# Patient Record
Sex: Female | Born: 1945
Health system: Southern US, Community
[De-identification: ages and names within clinical notes are randomized; demographics above are authoritative.]

## PROBLEM LIST (undated history)

## (undated) DIAGNOSIS — IMO0002 Reserved for concepts with insufficient information to code with codable children: Secondary | ICD-10-CM

## (undated) DIAGNOSIS — Z973 Presence of spectacles and contact lenses: Secondary | ICD-10-CM

## (undated) DIAGNOSIS — I10 Essential (primary) hypertension: Secondary | ICD-10-CM

## (undated) DIAGNOSIS — IMO0001 Reserved for inherently not codable concepts without codable children: Secondary | ICD-10-CM

## (undated) DIAGNOSIS — F419 Anxiety disorder, unspecified: Secondary | ICD-10-CM

## (undated) HISTORY — DX: Reserved for inherently not codable concepts without codable children: IMO0001

## (undated) HISTORY — PX: TONSILLECTOMY: SUR1361

## (undated) HISTORY — PX: BREAST ENHANCEMENT SURGERY: SHX7

## (undated) HISTORY — DX: Reserved for concepts with insufficient information to code with codable children: IMO0002

## (undated) HISTORY — PX: TUBAL LIGATION: SHX77

---

## 2012-02-26 DIAGNOSIS — S63639A Sprain of interphalangeal joint of unspecified finger, initial encounter: Secondary | ICD-10-CM | POA: Diagnosis not present

## 2012-03-05 DIAGNOSIS — S63639A Sprain of interphalangeal joint of unspecified finger, initial encounter: Secondary | ICD-10-CM | POA: Diagnosis not present

## 2012-05-28 DIAGNOSIS — E785 Hyperlipidemia, unspecified: Secondary | ICD-10-CM | POA: Diagnosis not present

## 2012-05-28 DIAGNOSIS — R82998 Other abnormal findings in urine: Secondary | ICD-10-CM | POA: Diagnosis not present

## 2012-05-28 DIAGNOSIS — I10 Essential (primary) hypertension: Secondary | ICD-10-CM | POA: Diagnosis not present

## 2012-06-04 DIAGNOSIS — E785 Hyperlipidemia, unspecified: Secondary | ICD-10-CM | POA: Diagnosis not present

## 2012-06-04 DIAGNOSIS — I10 Essential (primary) hypertension: Secondary | ICD-10-CM | POA: Diagnosis not present

## 2012-06-04 DIAGNOSIS — Z0000 Encounter for general adult medical examination without abnormal findings: Secondary | ICD-10-CM | POA: Diagnosis not present

## 2012-09-18 DIAGNOSIS — R928 Other abnormal and inconclusive findings on diagnostic imaging of breast: Secondary | ICD-10-CM | POA: Diagnosis not present

## 2012-09-18 DIAGNOSIS — Z09 Encounter for follow-up examination after completed treatment for conditions other than malignant neoplasm: Secondary | ICD-10-CM | POA: Diagnosis not present

## 2012-11-18 DIAGNOSIS — L570 Actinic keratosis: Secondary | ICD-10-CM | POA: Diagnosis not present

## 2013-01-15 DIAGNOSIS — H43819 Vitreous degeneration, unspecified eye: Secondary | ICD-10-CM | POA: Diagnosis not present

## 2013-02-18 DIAGNOSIS — H43819 Vitreous degeneration, unspecified eye: Secondary | ICD-10-CM | POA: Diagnosis not present

## 2013-02-18 DIAGNOSIS — D3130 Benign neoplasm of unspecified choroid: Secondary | ICD-10-CM | POA: Diagnosis not present

## 2013-05-29 DIAGNOSIS — E785 Hyperlipidemia, unspecified: Secondary | ICD-10-CM | POA: Diagnosis not present

## 2013-05-29 DIAGNOSIS — R82998 Other abnormal findings in urine: Secondary | ICD-10-CM | POA: Diagnosis not present

## 2013-05-29 DIAGNOSIS — I10 Essential (primary) hypertension: Secondary | ICD-10-CM | POA: Diagnosis not present

## 2013-06-05 DIAGNOSIS — Z0000 Encounter for general adult medical examination without abnormal findings: Secondary | ICD-10-CM | POA: Diagnosis not present

## 2013-06-05 DIAGNOSIS — IMO0002 Reserved for concepts with insufficient information to code with codable children: Secondary | ICD-10-CM | POA: Diagnosis not present

## 2013-06-05 DIAGNOSIS — I10 Essential (primary) hypertension: Secondary | ICD-10-CM | POA: Diagnosis not present

## 2013-06-05 DIAGNOSIS — E785 Hyperlipidemia, unspecified: Secondary | ICD-10-CM | POA: Diagnosis not present

## 2013-06-05 DIAGNOSIS — Z1331 Encounter for screening for depression: Secondary | ICD-10-CM | POA: Diagnosis not present

## 2013-06-06 DIAGNOSIS — Z1212 Encounter for screening for malignant neoplasm of rectum: Secondary | ICD-10-CM | POA: Diagnosis not present

## 2013-07-19 DIAGNOSIS — S63659A Sprain of metacarpophalangeal joint of unspecified finger, initial encounter: Secondary | ICD-10-CM | POA: Diagnosis not present

## 2013-07-28 ENCOUNTER — Other Ambulatory Visit: Payer: Self-pay | Admitting: Orthopedic Surgery

## 2013-07-28 DIAGNOSIS — M79645 Pain in left finger(s): Secondary | ICD-10-CM

## 2013-07-28 DIAGNOSIS — S63659A Sprain of metacarpophalangeal joint of unspecified finger, initial encounter: Secondary | ICD-10-CM | POA: Diagnosis not present

## 2013-08-05 DIAGNOSIS — M899 Disorder of bone, unspecified: Secondary | ICD-10-CM | POA: Diagnosis not present

## 2013-08-06 ENCOUNTER — Ambulatory Visit
Admission: RE | Admit: 2013-08-06 | Discharge: 2013-08-06 | Disposition: A | Discharge status: Home / Self Care | Payer: Medicare Other | Source: Ambulatory Visit | Attending: Orthopedic Surgery | Admitting: Orthopedic Surgery

## 2013-08-06 DIAGNOSIS — M7989 Other specified soft tissue disorders: Secondary | ICD-10-CM | POA: Diagnosis not present

## 2013-08-06 DIAGNOSIS — M79645 Pain in left finger(s): Secondary | ICD-10-CM

## 2013-08-06 DIAGNOSIS — M25549 Pain in joints of unspecified hand: Secondary | ICD-10-CM | POA: Diagnosis not present

## 2013-08-06 DIAGNOSIS — M79609 Pain in unspecified limb: Secondary | ICD-10-CM | POA: Diagnosis not present

## 2013-08-06 MED ORDER — IOHEXOL 180 MG/ML  SOLN
1.0000 mL | Freq: Once | INTRAMUSCULAR | Status: AC | PRN
  Administered 2013-08-06: 1 mL via INTRA_ARTICULAR

## 2013-08-07 DIAGNOSIS — Z23 Encounter for immunization: Secondary | ICD-10-CM | POA: Diagnosis not present

## 2013-08-13 ENCOUNTER — Other Ambulatory Visit: Payer: Self-pay | Admitting: Orthopedic Surgery

## 2013-08-13 ENCOUNTER — Encounter (HOSPITAL_BASED_OUTPATIENT_CLINIC_OR_DEPARTMENT_OTHER): Payer: Self-pay | Admitting: *Deleted

## 2013-08-13 DIAGNOSIS — S63659A Sprain of metacarpophalangeal joint of unspecified finger, initial encounter: Secondary | ICD-10-CM | POA: Diagnosis not present

## 2013-08-13 NOTE — Progress Notes (Signed)
To come in for ekg-bmet-no cardiac-had ekg 3 yr ago dr Neale Burly

## 2013-08-14 ENCOUNTER — Encounter (HOSPITAL_BASED_OUTPATIENT_CLINIC_OR_DEPARTMENT_OTHER)
Admission: RE | Admit: 2013-08-14 | Discharge: 2013-08-14 | Disposition: A | Discharge status: Home / Self Care | Payer: Medicare Other | Source: Ambulatory Visit | Attending: Orthopedic Surgery | Admitting: Orthopedic Surgery

## 2013-08-14 DIAGNOSIS — Z01810 Encounter for preprocedural cardiovascular examination: Secondary | ICD-10-CM | POA: Diagnosis not present

## 2013-08-14 DIAGNOSIS — IMO0002 Reserved for concepts with insufficient information to code with codable children: Secondary | ICD-10-CM | POA: Diagnosis not present

## 2013-08-14 DIAGNOSIS — I10 Essential (primary) hypertension: Secondary | ICD-10-CM | POA: Diagnosis not present

## 2013-08-14 DIAGNOSIS — Z01812 Encounter for preprocedural laboratory examination: Secondary | ICD-10-CM | POA: Diagnosis not present

## 2013-08-14 LAB — BASIC METABOLIC PANEL
BUN: 14 mg/dL (ref 6–23)
CO2: 29 mEq/L (ref 19–32)
Calcium: 9.8 mg/dL (ref 8.4–10.5)
GFR calc non Af Amer: 90 mL/min — ABNORMAL LOW (ref >90)
Glucose, Bld: 85 mg/dL (ref 70–99)
Sodium: 136 mEq/L (ref 135–145)

## 2013-08-18 ENCOUNTER — Ambulatory Visit (HOSPITAL_BASED_OUTPATIENT_CLINIC_OR_DEPARTMENT_OTHER)
Admission: RE | Admit: 2013-08-18 | Discharge: 2013-08-18 | Disposition: A | Discharge status: Home / Self Care | Payer: Medicare Other | Source: Ambulatory Visit | Attending: Orthopedic Surgery | Admitting: Orthopedic Surgery

## 2013-08-18 ENCOUNTER — Ambulatory Visit (HOSPITAL_BASED_OUTPATIENT_CLINIC_OR_DEPARTMENT_OTHER): Payer: Medicare Other | Admitting: Certified Registered Nurse Anesthetist

## 2013-08-18 ENCOUNTER — Encounter (HOSPITAL_BASED_OUTPATIENT_CLINIC_OR_DEPARTMENT_OTHER): Payer: Self-pay | Admitting: Certified Registered Nurse Anesthetist

## 2013-08-18 ENCOUNTER — Encounter (HOSPITAL_BASED_OUTPATIENT_CLINIC_OR_DEPARTMENT_OTHER)
Admission: RE | Disposition: A | Discharge status: Home / Self Care | Payer: Self-pay | Source: Ambulatory Visit | Attending: Orthopedic Surgery

## 2013-08-18 DIAGNOSIS — Z01812 Encounter for preprocedural laboratory examination: Secondary | ICD-10-CM | POA: Diagnosis not present

## 2013-08-18 DIAGNOSIS — S63659A Sprain of metacarpophalangeal joint of unspecified finger, initial encounter: Secondary | ICD-10-CM | POA: Diagnosis not present

## 2013-08-18 DIAGNOSIS — I10 Essential (primary) hypertension: Secondary | ICD-10-CM | POA: Diagnosis not present

## 2013-08-18 DIAGNOSIS — IMO0002 Reserved for concepts with insufficient information to code with codable children: Secondary | ICD-10-CM | POA: Insufficient documentation

## 2013-08-18 DIAGNOSIS — W19XXXA Unspecified fall, initial encounter: Secondary | ICD-10-CM | POA: Insufficient documentation

## 2013-08-18 HISTORY — DX: Essential (primary) hypertension: I10

## 2013-08-18 HISTORY — PX: ULNAR COLLATERAL LIGAMENT REPAIR: SHX6159

## 2013-08-18 HISTORY — DX: Presence of spectacles and contact lenses: Z97.3

## 2013-08-18 SURGERY — REPAIR, LIGAMENT, ULNAR COLLATERAL
Anesthesia: General | Site: Thumb | Laterality: Left | Wound class: Clean

## 2013-08-18 MED ORDER — FENTANYL CITRATE 0.05 MG/ML IJ SOLN: 50.0000 ug | INTRAMUSCULAR | Status: DC | PRN

## 2013-08-18 MED ORDER — LIDOCAINE HCL (CARDIAC) 20 MG/ML IV SOLN
INTRAVENOUS | Status: DC | PRN
  Administered 2013-08-18: 60 mg via INTRAVENOUS

## 2013-08-18 MED ORDER — PROPOFOL 10 MG/ML IV BOLUS
INTRAVENOUS | Status: DC | PRN
  Administered 2013-08-18: 200 mg via INTRAVENOUS

## 2013-08-18 MED ORDER — BUPIVACAINE HCL (PF) 0.25 % IJ SOLN
INTRAMUSCULAR | Status: DC | PRN
  Administered 2013-08-18: 7 mL

## 2013-08-18 MED ORDER — OXYCODONE HCL 5 MG PO TABS
5.0000 mg | ORAL_TABLET | Freq: Once | ORAL | Status: AC | PRN
Start: 2013-08-18 — End: 2013-08-18
  Administered 2013-08-18: 5 mg via ORAL

## 2013-08-18 MED ORDER — MIDAZOLAM HCL 2 MG/2ML IJ SOLN: 1.0000 mg | INTRAMUSCULAR | Status: DC | PRN

## 2013-08-18 MED ORDER — MIDAZOLAM HCL 5 MG/5ML IJ SOLN
INTRAMUSCULAR | Status: DC | PRN
  Administered 2013-08-18: 1 mg via INTRAVENOUS

## 2013-08-18 MED ORDER — DEXAMETHASONE SODIUM PHOSPHATE 10 MG/ML IJ SOLN
INTRAMUSCULAR | Status: DC | PRN
  Administered 2013-08-18: 10 mg via INTRAVENOUS

## 2013-08-18 MED ORDER — CHLORHEXIDINE GLUCONATE 4 % EX LIQD: 60.0000 mL | Freq: Once | CUTANEOUS | Status: DC

## 2013-08-18 MED ORDER — FENTANYL CITRATE 0.05 MG/ML IJ SOLN
INTRAMUSCULAR | Status: DC | PRN
  Administered 2013-08-18 (×2): 25 ug via INTRAVENOUS
  Administered 2013-08-18: 50 ug via INTRAVENOUS
  Administered 2013-08-18: 25 ug via INTRAVENOUS

## 2013-08-18 MED ORDER — ONDANSETRON HCL 4 MG/2ML IJ SOLN
INTRAMUSCULAR | Status: DC | PRN
  Administered 2013-08-18: 4 mg via INTRAMUSCULAR

## 2013-08-18 MED ORDER — HYDROCODONE-ACETAMINOPHEN 5-325 MG PO TABS: ORAL_TABLET | ORAL | Status: DC

## 2013-08-18 MED ORDER — PROMETHAZINE HCL 25 MG/ML IJ SOLN: 6.2500 mg | INTRAMUSCULAR | Status: DC | PRN

## 2013-08-18 MED ORDER — HYDROMORPHONE HCL PF 1 MG/ML IJ SOLN
0.2500 mg | INTRAMUSCULAR | Status: DC | PRN
  Administered 2013-08-18: 0.5 mg via INTRAVENOUS

## 2013-08-18 MED ORDER — OXYCODONE HCL 5 MG/5ML PO SOLN: 5.0000 mg | Freq: Once | ORAL | Status: AC | PRN

## 2013-08-18 MED ORDER — LACTATED RINGERS IV SOLN
INTRAVENOUS | Status: DC
  Administered 2013-08-18 (×2): via INTRAVENOUS

## 2013-08-18 MED ORDER — CEFAZOLIN SODIUM-DEXTROSE 2-3 GM-% IV SOLR
2.0000 g | INTRAVENOUS | Status: AC
  Administered 2013-08-18: 2 g via INTRAVENOUS

## 2013-08-18 SURGICAL SUPPLY — 74 items
ANCH SUT 2-0 MN NDL DRL PLSTR (Anchor) ×1 IMPLANT
ANCHOR JUGGERKNOT 1.0 1DR 2-0 (Anchor) ×1 IMPLANT
BANDAGE ELASTIC 3 VELCRO ST LF (GAUZE/BANDAGES/DRESSINGS) IMPLANT
BANDAGE GAUZE ELAST BULKY 4 IN (GAUZE/BANDAGES/DRESSINGS) ×2 IMPLANT
BLADE MINI RND TIP GREEN BEAV (BLADE) ×2 IMPLANT
BLADE SURG 15 STRL LF DISP TIS (BLADE) ×2 IMPLANT
BLADE SURG 15 STRL SS (BLADE) ×4
BNDG CMPR 9X4 STRL LF SNTH (GAUZE/BANDAGES/DRESSINGS) ×1
BNDG CMPR MD 5X2 ELC HKLP STRL (GAUZE/BANDAGES/DRESSINGS)
BNDG ELASTIC 2 VLCR STRL LF (GAUZE/BANDAGES/DRESSINGS) IMPLANT
BNDG ESMARK 4X9 LF (GAUZE/BANDAGES/DRESSINGS) ×2 IMPLANT
CATH ROBINSON RED A/P 8FR (CATHETERS) IMPLANT
CHLORAPREP W/TINT 26ML (MISCELLANEOUS) ×2 IMPLANT
CLOTH BEACON ORANGE TIMEOUT ST (SAFETY) ×2 IMPLANT
CORDS BIPOLAR (ELECTRODE) ×2 IMPLANT
COVER MAYO STAND STRL (DRAPES) ×2 IMPLANT
COVER TABLE BACK 60X90 (DRAPES) ×2 IMPLANT
CUFF TOURNIQUET SINGLE 18IN (TOURNIQUET CUFF) ×2 IMPLANT
DECANTER SPIKE VIAL GLASS SM (MISCELLANEOUS) IMPLANT
DRAPE EXTREMITY T 121X128X90 (DRAPE) ×2 IMPLANT
DRAPE OEC MINIVIEW 54X84 (DRAPES) ×1 IMPLANT
DRAPE SURG 17X23 STRL (DRAPES) ×2 IMPLANT
DRSG PAD ABDOMINAL 8X10 ST (GAUZE/BANDAGES/DRESSINGS) ×1 IMPLANT
GAUZE XEROFORM 1X8 LF (GAUZE/BANDAGES/DRESSINGS) ×2 IMPLANT
GLOVE BIO SURGEON STRL SZ 6.5 (GLOVE) ×1 IMPLANT
GLOVE BIO SURGEON STRL SZ7.5 (GLOVE) ×2 IMPLANT
GLOVE BIOGEL PI IND STRL 7.0 (GLOVE) ×2 IMPLANT
GLOVE BIOGEL PI IND STRL 8 (GLOVE) ×1 IMPLANT
GLOVE BIOGEL PI IND STRL 8.5 (GLOVE) ×1 IMPLANT
GLOVE BIOGEL PI INDICATOR 7.0 (GLOVE) ×2
GLOVE BIOGEL PI INDICATOR 8 (GLOVE) ×1
GLOVE BIOGEL PI INDICATOR 8.5 (GLOVE)
GLOVE SURG ORTHO 8.0 STRL STRW (GLOVE) IMPLANT
GOWN BRE IMP PREV XXLGXLNG (GOWN DISPOSABLE) ×2 IMPLANT
GOWN PREVENTION PLUS XLARGE (GOWN DISPOSABLE) ×2 IMPLANT
K-WIRE .035X4 (WIRE) IMPLANT
NDL SAFETY ECLIPSE 18X1.5 (NEEDLE) IMPLANT
NEEDLE HYPO 18GX1.5 SHARP (NEEDLE)
NEEDLE HYPO 22GX1.5 SAFETY (NEEDLE) IMPLANT
NEEDLE HYPO 25X1 1.5 SAFETY (NEEDLE) ×2 IMPLANT
NEEDLE KEITH (NEEDLE) IMPLANT
NS IRRIG 1000ML POUR BTL (IV SOLUTION) ×2 IMPLANT
PACK BASIN DAY SURGERY FS (CUSTOM PROCEDURE TRAY) ×2 IMPLANT
PAD CAST 3X4 CTTN HI CHSV (CAST SUPPLIES) ×1 IMPLANT
PAD CAST 4YDX4 CTTN HI CHSV (CAST SUPPLIES) IMPLANT
PADDING CAST ABS 4INX4YD NS (CAST SUPPLIES) ×1
PADDING CAST ABS COTTON 4X4 ST (CAST SUPPLIES) ×1 IMPLANT
PADDING CAST COTTON 3X4 STRL (CAST SUPPLIES) ×2
PADDING CAST COTTON 4X4 STRL (CAST SUPPLIES)
PASSER SUT SWANSON 36MM LOOP (INSTRUMENTS) IMPLANT
SLEEVE SCD COMPRESS KNEE MED (MISCELLANEOUS) ×1 IMPLANT
SPONGE GAUZE 4X4 12PLY (GAUZE/BANDAGES/DRESSINGS) ×2 IMPLANT
STAPLER VISISTAT 35W (STAPLE) IMPLANT
STOCKINETTE 4X48 STRL (DRAPES) ×2 IMPLANT
SUT ETHIBOND 3-0 V-5 (SUTURE) IMPLANT
SUT ETHILON 3 0 PS 1 (SUTURE) IMPLANT
SUT ETHILON 4 0 PS 2 18 (SUTURE) ×2 IMPLANT
SUT FIBERWIRE 2-0 18 17.9 3/8 (SUTURE)
SUT FIBERWIRE 3-0 18 TAPR NDL (SUTURE)
SUT MERSILENE 2.0 SH NDLE (SUTURE) IMPLANT
SUT MERSILENE 4 0 P 3 (SUTURE) ×2 IMPLANT
SUT MERSILENE 6 0 P 1 (SUTURE) IMPLANT
SUT MNCRL AB 4-0 PS2 18 (SUTURE) ×1 IMPLANT
SUT SILK 4 0 PS 2 (SUTURE) IMPLANT
SUT VIC AB 0 SH 27 (SUTURE) IMPLANT
SUT VIC AB 4-0 P-3 18XBRD (SUTURE) IMPLANT
SUT VIC AB 4-0 P3 18 (SUTURE) ×2
SUT VICRYL 4-0 PS2 18IN ABS (SUTURE) IMPLANT
SUTURE FIBERWR 2-0 18 17.9 3/8 (SUTURE) IMPLANT
SUTURE FIBERWR 3-0 18 TAPR NDL (SUTURE) IMPLANT
SYR BULB 3OZ (MISCELLANEOUS) ×2 IMPLANT
SYR CONTROL 10ML LL (SYRINGE) ×1 IMPLANT
TOWEL OR 17X24 6PK STRL BLUE (TOWEL DISPOSABLE) ×4 IMPLANT
UNDERPAD 30X30 INCONTINENT (UNDERPADS AND DIAPERS) ×2 IMPLANT

## 2013-08-18 NOTE — H&P (Signed)
Stacey Doyle is an 67 y.o. female.   Chief Complaint: left thumb ulnar collateral ligament injury HPI: 67 yo rhd female states she injured left thumb in a fall one month ago.  Pain in mp joint of thumb.  Has been wearing thumb spica splint.  MRI confirms UCL tear with Stener lesion.  She wishes to have Neurosurgeon.  Past Medical History  Diagnosis Date  . Hypertension   . Wears glasses     Past Surgical History  Procedure Laterality Date  . Tonsillectomy    . Breast enhancement surgery  [id number]    implants  . Tubal ligation      History reviewed. No pertinent family history. Social History:  reports that she quit smoking about 16 years ago. She does not have any smokeless tobacco history on file. She reports that  drinks alcohol. She reports that she does not use illicit drugs.  Allergies:  Allergies  Allergen Reactions  . Percocet [Oxycodone-Acetaminophen]     Nightmares, makes nervous, skin crawles    Medications Prior to Admission  Medication Sig Dispense Refill  . calcium carbonate (OS-CAL - DOSED IN MG OF ELEMENTAL CALCIUM) 1250 MG tablet Take 1 tablet by mouth.      . cholecalciferol (VITAMIN D) 1000 UNITS tablet Take 1,000 Units by mouth daily.      Marland Kitchen glucosamine-chondroitin 500-400 MG tablet Take 1 tablet by mouth 3 (three) times daily.      Marland Kitchen olmesartan (BENICAR) 20 MG tablet Take 20 mg by mouth daily.      . vitamin C (ASCORBIC ACID) 250 MG tablet Take 250 mg by mouth daily.        No results found for this or any previous visit (from the past 48 hour(s)).  No results found.   A comprehensive review of systems was negative except for: Eyes: positive for contacts/glasses Ears, nose, mouth, throat, and face: positive for tinnitus  Blood pressure 157/87, pulse 83, temperature 97.8 F (36.6 C), temperature source Oral, resp. rate 20, height 5\' 4"  (1.626 m), weight 141 lb (63.957 kg), SpO2 98.00%.  General appearance: alert, cooperative and appears stated  age Head: Normocephalic, without obvious abnormality, atraumatic Neck: supple, symmetrical, trachea midline Resp: clear to auscultation bilaterally Cardio: regular rate and rhythm GI: non tender Extremities: intact sensation and capillary refill all digits.  +epl/fpl/io.  laxity to ucl left thumb.  skin intact. Pulses: 2+ and symmetric Skin: Skin color, texture, turgor normal. No rashes or lesions Neurologic: Grossly normal Incision/Wound: na  Assessment/Plan Left thumb UCL injury.  Non operative and operative treatment options were discussed with the patient and patient wishes to proceed with operative treatment. Risks, benefits, and alternatives of surgery were discussed and the patient agrees with the plan of care.   Rashaan Wyles R 08/18/2013, 8:30 AM

## 2013-08-18 NOTE — Anesthesia Postprocedure Evaluation (Signed)
Anesthesia Post Note  Patient: Stacey Doyle  Procedure(s) Performed: Procedure(s) (LRB): LEFT THUMB ULNAR COLLATERAL LIGAMENT REPAIR (Left)  Anesthesia type: general  Patient location: PACU  Post pain: Pain level controlled  Post assessment: Patient's Cardiovascular Status Stable  Last Vitals:  Filed Vitals:   08/18/13 1145  BP: 138/62  Pulse: 69  Temp:   Resp: 16    Post vital signs: Reviewed and stable  Level of consciousness: sedated  Complications: No apparent anesthesia complications

## 2013-08-18 NOTE — Anesthesia Procedure Notes (Addendum)
Procedure Name: LMA Insertion Date/Time: 08/18/2013 9:25 AM Performed by: Ambera Fedele D Pre-anesthesia Checklist: Patient identified, Emergency Drugs available, Suction available and Patient being monitored Patient Re-evaluated:Patient Re-evaluated prior to inductionOxygen Delivery Method: Circle System Utilized Preoxygenation: Pre-oxygenation with 100% oxygen Intubation Type: IV induction Ventilation: Mask ventilation without difficulty LMA: LMA inserted LMA Size: 4.0 Number of attempts: 1 Airway Equipment and Method: bite block Placement Confirmation: positive ETCO2 Tube secured with: Tape Dental Injury: Teeth and Oropharynx as per pre-operative assessment

## 2013-08-18 NOTE — Transfer of Care (Signed)
Immediate Anesthesia Transfer of Care Note  Patient: Stacey Doyle  Procedure(s) Performed: Procedure(s): LEFT THUMB ULNAR COLLATERAL LIGAMENT REPAIR (Left)  Patient Location: PACU  Anesthesia Type:General  Level of Consciousness: awake, alert , oriented and patient cooperative  Airway & Oxygen Therapy: Patient Spontanous Breathing and Patient connected to face mask oxygen  Post-op Assessment: Report given to PACU RN and Post -op Vital signs reviewed and stable  Post vital signs: Reviewed and stable  Complications: No apparent anesthesia complications

## 2013-08-18 NOTE — Op Note (Signed)
[id number] 

## 2013-08-18 NOTE — Anesthesia Preprocedure Evaluation (Addendum)
Anesthesia Evaluation  Patient identified by MRN, date of birth, ID band Patient awake    Reviewed: Allergy & Precautions, NPO status   Airway Mallampati: II TM Distance: >3 FB Neck ROM: Full    Dental  (+) Teeth Intact and Dental Advisory Given   Pulmonary former smoker,    Pulmonary exam normal       Cardiovascular hypertension, Pt. on medications     Neuro/Psych negative neurological ROS  negative psych ROS   GI/Hepatic negative GI ROS, Neg liver ROS,   Endo/Other  negative endocrine ROS  Renal/GU negative Renal ROS     Musculoskeletal   Abdominal   Peds  Hematology   Anesthesia Other Findings   Reproductive/Obstetrics                         Anesthesia Physical Anesthesia Plan  ASA: II  Anesthesia Plan: General   Post-op Pain Management:    Induction: Intravenous  Airway Management Planned: LMA  Additional Equipment:   Intra-op Plan:   Post-operative Plan: Extubation in OR  Informed Consent: I have reviewed the patients History and Physical, chart, labs and discussed the procedure including the risks, benefits and alternatives for the proposed anesthesia with the patient or authorized representative who has indicated his/her understanding and acceptance.   Dental advisory given  Plan Discussed with: CRNA, Anesthesiologist and Surgeon  Anesthesia Plan Comments:        Anesthesia Quick Evaluation

## 2013-08-18 NOTE — Brief Op Note (Signed)
08/18/2013  10:47 AM  PATIENT:  Cleda Mccreedy  67 y.o. female  PRE-OPERATIVE DIAGNOSIS:  LEFT THUMB UCL TEAR 842.12  POST-OPERATIVE DIAGNOSIS:  left ulnarcollateral ligament tear thumb  PROCEDURE:  Procedure(s): LEFT THUMB ULNAR COLLATERAL LIGAMENT REPAIR  SURGEON:  Surgeon(s): Tami Ribas, MD  PHYSICIAN ASSISTANT:   ASSISTANTS: none   ANESTHESIA:   general  EBL:  Total I/O In: 1200 [I.V.:1200] Out: -   DRAINS: none   LOCAL MEDICATIONS USED:  MARCAINE     SPECIMEN:  No Specimen  DISPOSITION OF SPECIMEN:  N/A  COUNTS:  YES  TOURNIQUET:   Total Tourniquet Time Documented: Upper Arm (Left) - 61 minutes Total: Upper Arm (Left) - 61 minutes   DICTATION: .Other Dictation: Dictation Number [id number]  PLAN OF CARE: Discharge to home after PACU

## 2013-08-19 ENCOUNTER — Encounter (HOSPITAL_BASED_OUTPATIENT_CLINIC_OR_DEPARTMENT_OTHER): Payer: Self-pay | Admitting: Orthopedic Surgery

## 2013-08-19 NOTE — Op Note (Signed)
NAMEMarland Kitchen  SOUA, LENK NO.:  [id number]  MEDICAL RECORD NO.:  [id number]  LOCATION:                                 FACILITY:  PHYSICIAN:  Betha Loa, MD             DATE OF BIRTH:  DATE OF PROCEDURE:  08/18/2013 DATE OF DISCHARGE:                              OPERATIVE REPORT   PREOPERATIVE DIAGNOSIS:  Right left thumb ulnar collateral ligament injury.  POSTOPERATIVE DIAGNOSIS:  Right left thumb ulnar collateral ligament injury.  PROCEDURE:  Right thumb ulnar collateral ligament repair.  SURGEON:  Betha Loa, MD.  ASSISTANT:  None.  ANESTHESIA:  General.  IV FLUIDS:  Per anesthesia flow sheet.  ESTIMATED BLOOD LOSS:  Minimal.  COMPLICATIONS:  None.  SPECIMENS:  None.  TOURNIQUET TIME:  61 minutes.  DISPOSITION:  Stable to PACU.  INDICATIONS:  Ms. Kruk is a 66 year old female who 1 month ago fell injuring her left thumb.  She followed up with me in the office.  She had laxity on the ulnar side of the MP joint of the thumb.  An MRI confirmed ulnar collateral ligament tear.  I discussed with Ms. Bellmore the nature of the condition.  She elected to proceed with operative repair.  Risks, benefits, and alternatives of surgery were discussed including the risk of blood loss, infection, damage to nerves, vessels, tendons, ligaments, bone; failure of surgery; need for additional surgery, complications with wound healing, continued pain, and advancement of arthritis.  She voiced understanding of these risks and elected to proceed.  OPERATIVE COURSE:  After being identified preoperatively by myself, the patient and I agreed upon the procedure and site of the procedure. Surgical site was marked.  Risks, benefits, and alternatives of surgery were reviewed and she wished to proceed.  Surgical consent had been signed.  She was given IV Ancef as preoperative antibiotic prophylaxis. She was transferred to the operating room and placed on the  operating room table in supine position with the left upper extremity on arm board.  General anesthesia was induced by anesthesiologist.  Left upper extremity was prepped and draped in normal sterile orthopedic fashion. A surgical pause was performed between surgeons, Anesthesia, operating staff, and all were in agreement as to the patient, procedure, and site of procedure.  Tourniquet at the proximal aspect of the extremity was inflated to 250 mmHg after exsanguination of the hand by grabbing the forearm by Esmarch bandage.  A lazy-S incision was made at the ulnar side of the thumb at the MP joint.  This was carried into subcutaneous tissues by spreading technique.  Bipolar electrocautery was used to obtain hemostasis.  Care was taken to protect all the dorsal branches of the sensory nerve.  The adductor aponeurosis was incised.  The ulnar collateral ligament was identified.  It had been avulsed from the metacarpal head.  The sulcus over the ligament originates from was empty.  The ligament was found within the joint.  The joint was inspected.  There was a small thin sheet of articular cartilage which was removed.  The articular cartilage overall looked good.  The wound was copiously irrigated with sterile saline.  A knot suture anchor was used after preparing the sulcus with the curette.  This was used to repair the proximal aspect of the ligament back into the sulcus.  This was done with the thumb in 45 degrees of flexion.  Care was taken to ensure appropriate tension.  This provided stability of the thumb.  C- arm was used in AP and lateral projections to ensure appropriate position of the thumb which was the case.  There was no volar subluxation.  The repair was augmented with 4-0 Vicryl suture around the soft tissues.  The adductor aponeurosis was repaired with a 4-0 Mersilene in a running fashion.  The Vicryl suture was used in an interrupted fashion, subcutaneous tissues, and the  skin was closed with a running 5-0 Monocryl in a subcuticular fashion.  This was augmented with benzoin and Steri-Strips.  The wound was injected with 7 mL of 0.25% plain Marcaine to aid in postoperative analgesia.  It was then dressed with a sterile 4x4s and Kerlix bandage.  A thumb spica splint was placed and wrapped with a Kerlix and Ace bandage.  Tourniquet was deflated at 61 minutes.  Fingertips were pink with brisk capillary refill after deflation of the tourniquet.  The operative drapes were broken down.  The patient was awoken from anesthesia safely.  She has transferred back to the stretcher and taken to PACU in stable condition. I will see her back in the office in 1 week for postoperative followup. She will be given Norco 5/325, 1-2 p.o. q.6 hours p.r.n. pain, dispensed #30.     Betha Loa, MD     KK/MEDQ  D:  08/18/2013  T:  08/19/2013  Job:  [id number]

## 2013-09-29 DIAGNOSIS — S63659A Sprain of metacarpophalangeal joint of unspecified finger, initial encounter: Secondary | ICD-10-CM | POA: Diagnosis not present

## 2013-10-01 DIAGNOSIS — S63659A Sprain of metacarpophalangeal joint of unspecified finger, initial encounter: Secondary | ICD-10-CM | POA: Diagnosis not present

## 2014-04-07 DIAGNOSIS — H35369 Drusen (degenerative) of macula, unspecified eye: Secondary | ICD-10-CM | POA: Diagnosis not present

## 2014-06-02 DIAGNOSIS — R809 Proteinuria, unspecified: Secondary | ICD-10-CM | POA: Diagnosis not present

## 2014-06-02 DIAGNOSIS — E785 Hyperlipidemia, unspecified: Secondary | ICD-10-CM | POA: Diagnosis not present

## 2014-06-02 DIAGNOSIS — R82998 Other abnormal findings in urine: Secondary | ICD-10-CM | POA: Diagnosis not present

## 2014-06-02 DIAGNOSIS — I10 Essential (primary) hypertension: Secondary | ICD-10-CM | POA: Diagnosis not present

## 2014-06-09 DIAGNOSIS — R55 Syncope and collapse: Secondary | ICD-10-CM | POA: Diagnosis not present

## 2014-06-09 DIAGNOSIS — Z1331 Encounter for screening for depression: Secondary | ICD-10-CM | POA: Diagnosis not present

## 2014-06-09 DIAGNOSIS — M1710 Unilateral primary osteoarthritis, unspecified knee: Secondary | ICD-10-CM | POA: Diagnosis not present

## 2014-06-09 DIAGNOSIS — Z6825 Body mass index (BMI) 25.0-25.9, adult: Secondary | ICD-10-CM | POA: Diagnosis not present

## 2014-06-09 DIAGNOSIS — R7301 Impaired fasting glucose: Secondary | ICD-10-CM | POA: Diagnosis not present

## 2014-06-09 DIAGNOSIS — I10 Essential (primary) hypertension: Secondary | ICD-10-CM | POA: Diagnosis not present

## 2014-06-09 DIAGNOSIS — E785 Hyperlipidemia, unspecified: Secondary | ICD-10-CM | POA: Diagnosis not present

## 2014-06-09 DIAGNOSIS — Z0000 Encounter for general adult medical examination without abnormal findings: Secondary | ICD-10-CM | POA: Diagnosis not present

## 2014-06-09 DIAGNOSIS — IMO0002 Reserved for concepts with insufficient information to code with codable children: Secondary | ICD-10-CM | POA: Diagnosis not present

## 2014-06-29 DIAGNOSIS — R0982 Postnasal drip: Secondary | ICD-10-CM | POA: Diagnosis not present

## 2014-06-29 DIAGNOSIS — J0110 Acute frontal sinusitis, unspecified: Secondary | ICD-10-CM | POA: Diagnosis not present

## 2014-06-29 DIAGNOSIS — R059 Cough, unspecified: Secondary | ICD-10-CM | POA: Diagnosis not present

## 2014-06-29 DIAGNOSIS — R05 Cough: Secondary | ICD-10-CM | POA: Diagnosis not present

## 2014-08-20 DIAGNOSIS — R490 Dysphonia: Secondary | ICD-10-CM | POA: Diagnosis not present

## 2014-08-20 DIAGNOSIS — J387 Other diseases of larynx: Secondary | ICD-10-CM | POA: Diagnosis not present

## 2014-09-17 DIAGNOSIS — R300 Dysuria: Secondary | ICD-10-CM | POA: Diagnosis not present

## 2014-09-17 DIAGNOSIS — R8299 Other abnormal findings in urine: Secondary | ICD-10-CM | POA: Diagnosis not present

## 2015-04-19 DIAGNOSIS — H2513 Age-related nuclear cataract, bilateral: Secondary | ICD-10-CM | POA: Diagnosis not present

## 2015-04-19 DIAGNOSIS — D3132 Benign neoplasm of left choroid: Secondary | ICD-10-CM | POA: Diagnosis not present

## 2015-06-07 DIAGNOSIS — Z86010 Personal history of colonic polyps: Secondary | ICD-10-CM | POA: Diagnosis not present

## 2015-06-07 DIAGNOSIS — D122 Benign neoplasm of ascending colon: Secondary | ICD-10-CM | POA: Diagnosis not present

## 2015-06-07 DIAGNOSIS — D126 Benign neoplasm of colon, unspecified: Secondary | ICD-10-CM | POA: Diagnosis not present

## 2015-06-29 DIAGNOSIS — E785 Hyperlipidemia, unspecified: Secondary | ICD-10-CM | POA: Diagnosis not present

## 2015-06-29 DIAGNOSIS — R7301 Impaired fasting glucose: Secondary | ICD-10-CM | POA: Diagnosis not present

## 2015-06-29 DIAGNOSIS — I10 Essential (primary) hypertension: Secondary | ICD-10-CM | POA: Diagnosis not present

## 2015-07-14 DIAGNOSIS — R7301 Impaired fasting glucose: Secondary | ICD-10-CM | POA: Diagnosis not present

## 2015-07-14 DIAGNOSIS — Z1389 Encounter for screening for other disorder: Secondary | ICD-10-CM | POA: Diagnosis not present

## 2015-07-14 DIAGNOSIS — E785 Hyperlipidemia, unspecified: Secondary | ICD-10-CM | POA: Diagnosis not present

## 2015-07-14 DIAGNOSIS — Z23 Encounter for immunization: Secondary | ICD-10-CM | POA: Diagnosis not present

## 2015-07-14 DIAGNOSIS — Z0000 Encounter for general adult medical examination without abnormal findings: Secondary | ICD-10-CM | POA: Diagnosis not present

## 2015-07-14 DIAGNOSIS — D126 Benign neoplasm of colon, unspecified: Secondary | ICD-10-CM | POA: Diagnosis not present

## 2015-07-14 DIAGNOSIS — M179 Osteoarthritis of knee, unspecified: Secondary | ICD-10-CM | POA: Diagnosis not present

## 2015-07-14 DIAGNOSIS — I10 Essential (primary) hypertension: Secondary | ICD-10-CM | POA: Diagnosis not present

## 2015-07-16 DIAGNOSIS — N951 Menopausal and female climacteric states: Secondary | ICD-10-CM | POA: Diagnosis not present

## 2015-07-16 DIAGNOSIS — R635 Abnormal weight gain: Secondary | ICD-10-CM | POA: Diagnosis not present

## 2015-07-23 DIAGNOSIS — N951 Menopausal and female climacteric states: Secondary | ICD-10-CM | POA: Diagnosis not present

## 2015-07-23 DIAGNOSIS — E663 Overweight: Secondary | ICD-10-CM | POA: Diagnosis not present

## 2015-07-23 DIAGNOSIS — K219 Gastro-esophageal reflux disease without esophagitis: Secondary | ICD-10-CM | POA: Diagnosis not present

## 2015-07-23 DIAGNOSIS — E039 Hypothyroidism, unspecified: Secondary | ICD-10-CM | POA: Diagnosis not present

## 2015-07-23 DIAGNOSIS — R7301 Impaired fasting glucose: Secondary | ICD-10-CM | POA: Diagnosis not present

## 2015-07-28 ENCOUNTER — Other Ambulatory Visit: Payer: Self-pay | Admitting: Internal Medicine

## 2015-07-28 DIAGNOSIS — Z1231 Encounter for screening mammogram for malignant neoplasm of breast: Secondary | ICD-10-CM

## 2015-07-29 DIAGNOSIS — E663 Overweight: Secondary | ICD-10-CM | POA: Diagnosis not present

## 2015-07-29 DIAGNOSIS — E039 Hypothyroidism, unspecified: Secondary | ICD-10-CM | POA: Diagnosis not present

## 2015-07-29 DIAGNOSIS — R7301 Impaired fasting glucose: Secondary | ICD-10-CM | POA: Diagnosis not present

## 2015-08-05 DIAGNOSIS — R7301 Impaired fasting glucose: Secondary | ICD-10-CM | POA: Diagnosis not present

## 2015-08-05 DIAGNOSIS — K219 Gastro-esophageal reflux disease without esophagitis: Secondary | ICD-10-CM | POA: Diagnosis not present

## 2015-08-05 DIAGNOSIS — E039 Hypothyroidism, unspecified: Secondary | ICD-10-CM | POA: Diagnosis not present

## 2015-08-05 DIAGNOSIS — E663 Overweight: Secondary | ICD-10-CM | POA: Diagnosis not present

## 2015-08-17 DIAGNOSIS — M79671 Pain in right foot: Secondary | ICD-10-CM | POA: Diagnosis not present

## 2015-08-17 DIAGNOSIS — M722 Plantar fascial fibromatosis: Secondary | ICD-10-CM | POA: Diagnosis not present

## 2015-08-24 DIAGNOSIS — M722 Plantar fascial fibromatosis: Secondary | ICD-10-CM | POA: Diagnosis not present

## 2015-08-24 DIAGNOSIS — L821 Other seborrheic keratosis: Secondary | ICD-10-CM | POA: Diagnosis not present

## 2015-08-24 DIAGNOSIS — M7751 Other enthesopathy of right foot: Secondary | ICD-10-CM | POA: Diagnosis not present

## 2015-08-24 DIAGNOSIS — L218 Other seborrheic dermatitis: Secondary | ICD-10-CM | POA: Diagnosis not present

## 2015-09-06 DIAGNOSIS — Z780 Asymptomatic menopausal state: Secondary | ICD-10-CM | POA: Diagnosis not present

## 2015-11-18 DIAGNOSIS — Z1231 Encounter for screening mammogram for malignant neoplasm of breast: Secondary | ICD-10-CM | POA: Diagnosis not present

## 2015-11-18 DIAGNOSIS — Z803 Family history of malignant neoplasm of breast: Secondary | ICD-10-CM | POA: Diagnosis not present

## 2015-12-30 DIAGNOSIS — Z1231 Encounter for screening mammogram for malignant neoplasm of breast: Secondary | ICD-10-CM | POA: Diagnosis not present

## 2015-12-30 DIAGNOSIS — Z803 Family history of malignant neoplasm of breast: Secondary | ICD-10-CM | POA: Diagnosis not present

## 2015-12-30 DIAGNOSIS — R928 Other abnormal and inconclusive findings on diagnostic imaging of breast: Secondary | ICD-10-CM | POA: Diagnosis not present

## 2015-12-30 DIAGNOSIS — R922 Inconclusive mammogram: Secondary | ICD-10-CM | POA: Diagnosis not present

## 2016-01-03 DIAGNOSIS — C50912 Malignant neoplasm of unspecified site of left female breast: Secondary | ICD-10-CM | POA: Diagnosis not present

## 2016-01-03 DIAGNOSIS — Z0000 Encounter for general adult medical examination without abnormal findings: Secondary | ICD-10-CM | POA: Diagnosis not present

## 2016-01-03 DIAGNOSIS — D0512 Intraductal carcinoma in situ of left breast: Secondary | ICD-10-CM | POA: Diagnosis not present

## 2016-01-03 DIAGNOSIS — R928 Other abnormal and inconclusive findings on diagnostic imaging of breast: Secondary | ICD-10-CM | POA: Diagnosis not present

## 2016-01-03 DIAGNOSIS — Z803 Family history of malignant neoplasm of breast: Secondary | ICD-10-CM | POA: Diagnosis not present

## 2016-01-03 DIAGNOSIS — N63 Unspecified lump in breast: Secondary | ICD-10-CM | POA: Diagnosis not present

## 2016-01-03 DIAGNOSIS — Z1231 Encounter for screening mammogram for malignant neoplasm of breast: Secondary | ICD-10-CM | POA: Diagnosis not present

## 2016-01-12 DIAGNOSIS — M179 Osteoarthritis of knee, unspecified: Secondary | ICD-10-CM | POA: Diagnosis not present

## 2016-01-12 DIAGNOSIS — E7800 Pure hypercholesterolemia, unspecified: Secondary | ICD-10-CM | POA: Diagnosis not present

## 2016-01-12 DIAGNOSIS — Z6827 Body mass index (BMI) 27.0-27.9, adult: Secondary | ICD-10-CM | POA: Diagnosis not present

## 2016-01-12 DIAGNOSIS — D126 Benign neoplasm of colon, unspecified: Secondary | ICD-10-CM | POA: Diagnosis not present

## 2016-01-12 DIAGNOSIS — M859 Disorder of bone density and structure, unspecified: Secondary | ICD-10-CM | POA: Diagnosis not present

## 2016-01-12 DIAGNOSIS — I10 Essential (primary) hypertension: Secondary | ICD-10-CM | POA: Diagnosis not present

## 2016-01-12 DIAGNOSIS — Z1389 Encounter for screening for other disorder: Secondary | ICD-10-CM | POA: Diagnosis not present

## 2016-01-12 DIAGNOSIS — R7301 Impaired fasting glucose: Secondary | ICD-10-CM | POA: Diagnosis not present

## 2016-01-12 DIAGNOSIS — C50919 Malignant neoplasm of unspecified site of unspecified female breast: Secondary | ICD-10-CM | POA: Diagnosis not present

## 2016-01-12 DIAGNOSIS — N898 Other specified noninflammatory disorders of vagina: Secondary | ICD-10-CM | POA: Diagnosis not present

## 2016-01-17 ENCOUNTER — Ambulatory Visit: Payer: Self-pay | Admitting: General Surgery

## 2016-01-17 DIAGNOSIS — C50412 Malignant neoplasm of upper-outer quadrant of left female breast: Secondary | ICD-10-CM | POA: Diagnosis not present

## 2016-01-17 DIAGNOSIS — C50912 Malignant neoplasm of unspecified site of left female breast: Secondary | ICD-10-CM

## 2016-01-17 NOTE — H&P (Signed)
Stacey Doyle 01/17/2016 11:55 AM Location: [facility] Patient #: [id number] DOB: Dec 17, 1945 Single / Language: Stacey Doyle / Race: White Female  History of Present Illness Stacey Doyle; 01/17/2016 12:50 PM) The patient is a 70 year old female.   Note:She is referred by Dr. Marcelo Doyle for consultation regarding newly diagnosed invasive ductal carcinoma of the left breast in the UOQ. ER positive, PR negative, Her2 negative, proliferation rate = 5%. She underwent a screening mammogram. Architectural distortion was noted in the upper outer quadrant. Ultrasound demonstrated a 1 cm mass at the 2:00 area. Image guided biopsy was performed demonstrating the above results. Of note is that she has saline implants. She has 2 aunts with breast cancer. No first degree relatives with breast cancer. Age at menarche was 22, first childbirth was early 99s, age at menopause was greater than 58. She has no breast complaints. She is here with her son, his girlfriend, and her daughter-in-law.  She was presented at the multidisciplinary breast cancer conference last week. The consensus was for genetic testing, preoperative medical oncology and radiation oncology consultations.  Other Problems [name], CMA; 01/17/2016 11:55 AM) Breast Cancer High blood pressure Lump In Breast  Past Surgical History [name], CMA; 01/17/2016 11:55 AM) Breast Augmentation Bilateral. Breast Biopsy Left. Colon Polyp Removal - Colonoscopy Colon Polyp Removal - Open Oral Surgery Tonsillectomy  Diagnostic Studies History [name], CMA; 01/17/2016 11:55 AM) Colonoscopy within last year Mammogram within last year Pap Smear 1-5 years ago  Allergies [name], CMA; 01/17/2016 11:56 AM) No Known Drug Allergies 01/17/2016  Medication History [name], CMA; 01/17/2016 11:57 AM) Benicar (20MG Tablet, Oral) Active. ClonazePAM (0.5MG Tablet, Oral) Active. Atorvastatin Calcium  (10MG Tablet, Oral) Active. Vitamin C (250MG Tablet, Oral) Active. Vitamin D (1000UNIT Tablet, Oral) Active. Vitamin E (100UNIT Capsule, Oral) Active. Calcium (500MG Tablet, Oral) Active. Medications Reconciled  Social History [name], [place]; 01/17/2016 11:55 AM) Alcohol use Moderate alcohol use. Caffeine use Coffee. No drug use Tobacco use Former smoker.  Family History [name], [place]; 01/17/2016 11:55 AM) Malignant Neoplasm Of Pancreas Father. Respiratory Condition Father. Thyroid problems Mother.  Pregnancy / Birth History [name], CMA; 01/17/2016 11:55 AM) Age at menarche 63 years. Age of menopause 96-60 Gravida 2 Maternal age 77-20 Para 2     Review of Systems [name] CMA; 01/17/2016 11:55 AM) General Not Present- Appetite Loss, Chills, Fatigue, Fever, Night Sweats, Weight Gain and Weight Loss. Skin Not Present- Change in Wart/Mole, Dryness, Hives, Jaundice, New Lesions, Non-Healing Wounds, Rash and Ulcer. HEENT Not Present- Earache, Hearing Loss, Hoarseness, Nose Bleed, Oral Ulcers, Ringing in the Ears, Seasonal Allergies, Sinus Pain, Sore Throat, Visual Disturbances, Wears glasses/contact lenses and Yellow Eyes. Respiratory Not Present- Bloody sputum, Chronic Cough, Difficulty Breathing, Snoring and Wheezing. Breast Present- Breast Mass. Not Present- Breast Pain, Nipple Discharge and Skin Changes. Cardiovascular Not Present- Chest Pain, Difficulty Breathing Lying Down, Leg Cramps, Palpitations, Rapid Heart Rate, Shortness of Breath and Swelling of Extremities. Gastrointestinal Not Present- Abdominal Pain, Bloating, Bloody Stool, Change in Bowel Habits, Chronic diarrhea, Constipation, Difficulty Swallowing, Excessive gas, Gets full quickly at meals, Hemorrhoids, Indigestion, Nausea, Rectal Pain and Vomiting. Female Genitourinary Not Present- Frequency, Nocturia, Painful Urination, Pelvic Pain and Urgency. Musculoskeletal Not Present- Back Pain,  Joint Pain, Joint Stiffness, Muscle Pain, Muscle Weakness and Swelling of Extremities. Neurological Not Present- Decreased Memory, Fainting, Headaches, Numbness, Seizures, Tingling, Tremor, Trouble walking and Weakness. Psychiatric Not Present- Anxiety, Bipolar, Change in Sleep Pattern, Depression, Fearful  and Frequent crying. Endocrine Not Present- Cold Intolerance, Excessive Hunger, Hair Changes, Heat Intolerance, Hot flashes and New Diabetes. Hematology Not Present- Easy Bruising, Excessive bleeding, Gland problems, HIV and Persistent Infections.  Vitals [name] CMA; 01/17/2016 11:57 AM) 01/17/2016 11:57 AM Weight: 157 lb Height: 63.75in Body Surface Area: 1.76 m Body Mass Index: 27.16 kg/m  Temp.: 97.2F(Temporal)  Pulse: 78 (Regular)  BP: 130/76 (Sitting, Left Arm, Standard)      Physical Exam Stacey Doyle; 01/17/2016 12:49 PM)  The physical exam findings are as follows: Note:General: WDWN in NAD. Pleasant and cooperative.  HEENT: [place]/AT, no facial masses  EYES: EOMI, no icterus  NECK: Supple, no obvious mass.  CV: RRR, no murmur, no JVD.  CHEST: Breath sounds equal and clear. Respirations nonlabored.  BREASTS: Symmetrical in size. No dominant masses, nipple discharge or suspicious skin lesions. Bilateral scars present.  ABDOMEN: Soft, nontender, nondistended, no masses, no organomegaly.  MUSCULOSKELETAL: FROM, good muscle tone, no edema, no venous stasis changes  LYMPHATIC: No palpable cervical, supraclavicular, axillary adenopathy.  SKIN: No jaundice or suspicious rashes.  NEUROLOGIC: Alert and oriented, answers questions appropriately, normal gait and station.  PSYCHIATRIC: Normal mood, affect , and behavior.    Assessment & Plan Stacey Doyle; 01/17/2016 12:52 PM)  MALIGNANT NEOPLASM OF UPPER-OUTER QUADRANT OF LEFT FEMALE BREAST (C50.412) Impression: We discussed the consensus opinion at the multidisciplinary breast cancer  conference last week. She is interested in breast conservation surgery.  Plan: Referral to medical oncology, radiation oncology, and genetics. Following those visits, schedule definitive surgery. We discussed left breast lumpectomy after radioactive seed localization and left axilla sentinel lymph node biopsy. I have explained the procedure, risks, and aftercare. The risks include but are not limited to bleeding, infection, wound problems, seroma formation, cosmetic deformity, anesthesia, nerve injury, lymphedema, need for reexcision or removal of more lymph nodes at a later time. She seems to understand and agrees with the overall plan. We will await the results of all the consultations prior to scheduling the surgery. Of note is that she has tickets to a [place] at the end of April and would like to go to this.  Current Plans Follow up as needed Referred to Oncology, for evaluation and follow up (Oncology). Routine. Referred to Genetic Counseling, for evaluation and follow up [employer]). Routine. Referred to Radiation Oncology, for evaluation and follow up (Radiation Oncology). Routine.  Jackolyn Confer, Doyle

## 2016-01-19 ENCOUNTER — Telehealth: Payer: Self-pay | Admitting: Hematology and Oncology

## 2016-01-19 ENCOUNTER — Encounter: Payer: Self-pay | Admitting: Hematology and Oncology

## 2016-01-19 NOTE — Telephone Encounter (Signed)
Notify Bernie @ [facility] and gave appt for 03/21 @ 1 w/Dr. Lindi Adie, 03/21 @ 2 w/Kaylqa Boggs.  Referring [facility]  Referral information scanned under media tab.

## 2016-01-21 ENCOUNTER — Telehealth: Payer: Self-pay | Admitting: *Deleted

## 2016-01-21 NOTE — Telephone Encounter (Signed)
Mailed new pt packet to pt.  

## 2016-01-31 NOTE — Progress Notes (Signed)
Location of Breast Cancer: invasive ductal carcinoma of the left breast in the UOQ  Histology per Pathology Report:   01/03/16   Receptor Status: ER(95% positive), PR (0%), Her2-neu (negative)  Did patient present with symptoms (if so, please note symptoms) or was this found on screening mammography?: mammogram  Past/Anticipated interventions by surgeon, if any: lumpectomy planned after medical oncology and radiation appointments.  Past/Anticipated interventions by medical oncology, if any: Per Dr. Lindi Adie "Oncotype DX testing to determine if chemotherapy would be of any benefit "  Lymphedema issues, if any:  no  Pain issues, if any:  no   OB Gyn:  Patient was 15 at first menses.  She was 54 with the birth of her first child.  She has 3 children.  She has not used bcp or hormone replacement.  SAFETY ISSUES:  Prior radiation? no  Pacemaker/ICD? no  Possible current pregnancy?no  Is the patient on methotrexate? no  Current Complaints / other details:  Patient is here with her daughter in law.  BP 108/54 mmHg  Pulse 76  Temp(Src) 97.8 F (36.6 C) (Oral)  Ht 5' 3.5" (1.613 m)  Wt 157 lb 6.4 oz (71.396 kg)  BMI 27.44 kg/m2

## 2016-02-01 ENCOUNTER — Other Ambulatory Visit: Payer: Medicare Other

## 2016-02-01 ENCOUNTER — Ambulatory Visit (HOSPITAL_BASED_OUTPATIENT_CLINIC_OR_DEPARTMENT_OTHER): Payer: Medicare Other | Admitting: Genetic Counselor

## 2016-02-01 ENCOUNTER — Ambulatory Visit (HOSPITAL_BASED_OUTPATIENT_CLINIC_OR_DEPARTMENT_OTHER): Payer: Medicare Other | Admitting: Hematology and Oncology

## 2016-02-01 ENCOUNTER — Encounter: Payer: Self-pay | Admitting: Hematology and Oncology

## 2016-02-01 ENCOUNTER — Encounter: Payer: Self-pay | Admitting: *Deleted

## 2016-02-01 VITALS — BP 104/66 | HR 89 | Temp 97.9°F | Resp 18 | Wt 155.2 lb

## 2016-02-01 DIAGNOSIS — C50412 Malignant neoplasm of upper-outer quadrant of left female breast: Secondary | ICD-10-CM | POA: Diagnosis not present

## 2016-02-01 DIAGNOSIS — Z86010 Personal history of colonic polyps: Secondary | ICD-10-CM

## 2016-02-01 DIAGNOSIS — Z8041 Family history of malignant neoplasm of ovary: Secondary | ICD-10-CM

## 2016-02-01 DIAGNOSIS — Z803 Family history of malignant neoplasm of breast: Secondary | ICD-10-CM | POA: Diagnosis not present

## 2016-02-01 DIAGNOSIS — Z800 Family history of malignant neoplasm of digestive organs: Secondary | ICD-10-CM

## 2016-02-01 DIAGNOSIS — Z315 Encounter for genetic counseling: Secondary | ICD-10-CM

## 2016-02-01 DIAGNOSIS — Z808 Family history of malignant neoplasm of other organs or systems: Secondary | ICD-10-CM | POA: Diagnosis not present

## 2016-02-01 DIAGNOSIS — Z87891 Personal history of nicotine dependence: Secondary | ICD-10-CM | POA: Diagnosis not present

## 2016-02-01 DIAGNOSIS — Z809 Family history of malignant neoplasm, unspecified: Secondary | ICD-10-CM | POA: Diagnosis not present

## 2016-02-01 NOTE — Progress Notes (Signed)
[place] NOTE  Patient Care Team: [name], MD as PCP - General (Internal Medicine)  CHIEF COMPLAINTS/PURPOSE OF CONSULTATION:  Newly diagnosed breast cancer  HISTORY OF PRESENTING ILLNESS:  Stacey Doyle 70 y.o. female is here because of recent diagnosis of left breast cancer. She had a routine screening mammogram that revealed architectural distortion the left breast. This was further investigated by diagnostic mammograms and breast ultrasound. The ultrasound revealed area for acute distortion measured 1 cm in size. She subsequently underwent ultrasound-guided biopsy of this mass which revealed invasive ductal carcinoma with DCIS that was ER positive PR negative HER-2 negative with a Ki-67 of 5%. (These are based upon Dr.Rosenbower's clinic notes. There was also evidence of lymphovascular invasion. She is here today accompanied by her granddaughter to discuss her treatment plan as well as to meet with the genetics counselor.  I reviewed her records extensively and collaborated the history with the patient.  SUMMARY OF ONCOLOGIC HISTORY:   Breast cancer of upper-outer quadrant of left female breast ([place])   12/30/2015 Initial Diagnosis Mammogram: Architectural distortion with spiculated and indistinct margin left breast central, 1 cm mass; biopsy: Invasive ductal carcinoma grade 1-2, with DCIS, LVID present, ER positive PR negative HER-2 negative Ki-67 5%, T1BN0 stage IA clinical stage    In terms of breast cancer risk profile:  She menarched at early age of 20 and went to menopause at age 63  She had 2 pregnancy, her first child was born at age 53  She has received birth control pills for approximately 5 years.  She was never exposed to fertility medications or hormone replacement therapy.  She has significant family history of Breast/GYN/GI cancer Aunt breast cancer in the 33s Aunt breast cancer in the 31s Another aunt with ovarian cancer in  8s Maternal cousin pancreatic cancer in 19s Maternal cousin ovarian cancer in the 15s  MEDICAL HISTORY:  Past Medical History  Diagnosis Date  . Hypertension   . Wears glasses     SURGICAL HISTORY: Past Surgical History  Procedure Laterality Date  . Tonsillectomy    . Breast enhancement surgery  [id number]    implants  . Tubal ligation    . Ulnar collateral ligament repair Left 08/18/2013    Procedure: LEFT THUMB ULNAR COLLATERAL LIGAMENT REPAIR;  Surgeon: Tennis Must, MD;  Location: [facility];  Service: Orthopedics;  Laterality: Left;    SOCIAL HISTORY: Social History   Social History  . Marital Status: Single    Spouse Name: N/A  . Number of Children: N/A  . Years of Education: N/A   Occupational History  . Not on file.   Social History Main Topics  . Smoking status: Former Smoker    Quit date: 08/13/1997  . Smokeless tobacco: Not on file  . Alcohol Use: Yes     Comment: occ  . Drug Use: No  . Sexual Activity: Not on file   Other Topics Concern  . Not on file   Social History Narrative  . No narrative on file    FAMILY HISTORY: No family history on file.  ALLERGIES:  is allergic to percocet.  MEDICATIONS:  Current Outpatient Prescriptions  Medication Sig Dispense Refill  . calcium carbonate (OS-CAL - DOSED IN MG OF ELEMENTAL CALCIUM) 1250 MG tablet Take 1 tablet by mouth.    . cholecalciferol (VITAMIN D) 1000 UNITS tablet Take 1,000 Units by mouth daily.    Marland Kitchen glucosamine-chondroitin 500-400 MG tablet Take  1 tablet by mouth 3 (three) times daily.    Marland Kitchen olmesartan (BENICAR) 20 MG tablet Take 20 mg by mouth daily.    . vitamin C (ASCORBIC ACID) 250 MG tablet Take 250 mg by mouth daily.     No current facility-administered medications for this visit.    REVIEW OF SYSTEMS:   Constitutional: Denies fevers, chills or abnormal night sweats Eyes: Denies blurriness of vision, double vision or watery eyes Ears, nose, mouth, throat, and  face: Denies mucositis or sore throat Respiratory: Denies cough, dyspnea or wheezes Cardiovascular: Denies palpitation, chest discomfort or lower extremity swelling Gastrointestinal:  Denies nausea, heartburn or change in bowel habits Skin: Denies abnormal skin rashes Lymphatics: Denies new lymphadenopathy or easy bruising Neurological:Denies numbness, tingling or new weaknesses Behavioral/Psych: Mood is stable, no new changes  Breast:  Denies any palpable lumps or discharge All other systems were reviewed with the patient and are negative.  PHYSICAL EXAMINATION: ECOG PERFORMANCE STATUS: 1 - Symptomatic but completely ambulatory  Filed Vitals:   02/01/16 1257  BP: 104/66  Pulse: 89  Temp: 97.9 F (36.6 C)  Resp: 18   Filed Weights   02/01/16 1257  Weight: 155 lb 3.2 oz (70.398 kg)    GENERAL:alert, no distress and comfortable SKIN: skin color, texture, turgor are normal, no rashes or significant lesions EYES: normal, conjunctiva are pink and non-injected, sclera clear OROPHARYNX:no exudate, no erythema and lips, buccal mucosa, and tongue normal  NECK: supple, thyroid normal size, non-tender, without nodularity LYMPH:  no palpable lymphadenopathy in the cervical, axillary or inguinal LUNGS: clear to auscultation and percussion with normal breathing effort HEART: regular rate & rhythm and no murmurs and no lower extremity edema ABDOMEN:abdomen soft, non-tender and normal bowel sounds Musculoskeletal:no cyanosis of digits and no clubbing  PSYCH: alert & oriented x 3 with fluent speech NEURO: no focal motor/sensory deficits BREAST: No palpable nodules in breast. No palpable axillary or supraclavicular lymphadenopathy (exam performed in the presence of a chaperone)   LABORATORY DATA:  I have reviewed the data as listed No results found for: WBC, HGB, HCT, MCV, PLT Lab Results  Component Value Date   NA 136 08/14/2013   K 4.0 08/14/2013   CL 97 08/14/2013   CO2 29  08/14/2013    RADIOGRAPHIC STUDIES: I have personally reviewed the radiological reports and agreed with the findings in the report.  ASSESSMENT AND PLAN:  Breast cancer of upper-outer quadrant of left female breast ([place]) Mammogram: Architectural distortion with spiculated and indistinct margin left breast central, 1 cm mass; biopsy: Invasive ductal carcinoma grade 1-2, with DCIS, LVID present, ER positive PR negative HER-2 negative Ki-67 5%, T1BN0 stage IA clinical stage  Pathology and radiology counseling:Discussed with the patient, the details of pathology including the type of breast cancer,the clinical staging, the significance of ER, PR and HER-2/neu receptors and the implications for treatment. After reviewing the pathology in detail, we proceeded to discuss the different treatment options between surgery, radiation, chemotherapy, antiestrogen therapies.  Recommendations: 1. Breast conserving surgery followed by 2. Oncotype DX testing to determine if chemotherapy would be of any benefit followed by 3. Adjuvant radiation therapy followed by 4. Adjuvant antiestrogen therapy  Oncotype counseling: I discussed Oncotype DX test. I explained to the patient that this is a 21 gene panel to evaluate patient tumors DNA to calculate recurrence score. This would help determine whether patient has high risk or intermediate risk or low risk breast cancer. She understands that if her tumor was  found to be high risk, she would benefit from systemic chemotherapy. If low risk, no need of chemotherapy. If she was found to be intermediate risk, we would need to evaluate the score as well as other risk factors and determine if an abbreviated chemotherapy may be of benefit.  Return to clinic after surgery to discuss final pathology report and then determine if Oncotype DX testing will need to be sent. Patient is an extremely active lady who exercises very regularly and enjoys [place] time at [place] [facility] [place]).  All questions were answered. The patient knows to call the clinic with any problems, questions or concerns.    Rulon Eisenmenger, MD 02/01/2016

## 2016-02-01 NOTE — Assessment & Plan Note (Signed)
Mammogram: Architectural distortion with spiculated and indistinct margin left breast central, 1 cm mass; biopsy: Invasive ductal carcinoma grade 1-2, with DCIS, LVID present, ER positive PR negative HER-2 negative Ki-67 5%, T1BN0 stage IA clinical stage  Pathology and radiology counseling:Discussed with the patient, the details of pathology including the type of breast cancer,the clinical staging, the significance of ER, PR and HER-2/neu receptors and the implications for treatment. After reviewing the pathology in detail, we proceeded to discuss the different treatment options between surgery, radiation, chemotherapy, antiestrogen therapies.  Recommendations: 1. Breast conserving surgery followed by 2. Oncotype DX testing to determine if chemotherapy would be of any benefit followed by 3. Adjuvant radiation therapy followed by 4. Adjuvant antiestrogen therapy  Oncotype counseling: I discussed Oncotype DX test. I explained to the patient that this is a 21 gene panel to evaluate patient tumors DNA to calculate recurrence score. This would help determine whether patient has high risk or intermediate risk or low risk breast cancer. She understands that if her tumor was found to be high risk, she would benefit from systemic chemotherapy. If low risk, no need of chemotherapy. If she was found to be intermediate risk, we would need to evaluate the score as well as other risk factors and determine if an abbreviated chemotherapy may be of benefit.  Return to clinic after surgery to discuss final pathology report and then determine if Oncotype DX testing will need to be sent.

## 2016-02-02 ENCOUNTER — Encounter: Payer: Self-pay | Admitting: Genetic Counselor

## 2016-02-02 DIAGNOSIS — Z86010 Personal history of colonic polyps: Secondary | ICD-10-CM | POA: Insufficient documentation

## 2016-02-02 DIAGNOSIS — Z800 Family history of malignant neoplasm of digestive organs: Secondary | ICD-10-CM | POA: Insufficient documentation

## 2016-02-02 DIAGNOSIS — Z803 Family history of malignant neoplasm of breast: Secondary | ICD-10-CM | POA: Insufficient documentation

## 2016-02-02 DIAGNOSIS — Z8041 Family history of malignant neoplasm of ovary: Secondary | ICD-10-CM | POA: Insufficient documentation

## 2016-02-02 NOTE — Progress Notes (Addendum)
REFERRING PROVIDER: Jackolyn Confer, MD   OTHER PROVIDER(S): Nicholas Lose, MD Gery Pray, MD  PRIMARY PROVIDER:  [name], MD  PRIMARY REASON FOR VISIT:  1. Breast cancer of upper-outer quadrant of left female breast ([place])   2. Family history of ovarian cancer   3. Family history of breast cancer in female   18. Family history of pancreatic cancer   5. History of colonic polyps   6. Family history of skin cancer   7. Family history of esophageal cancer   8. Family history of cancer      HISTORY OF PRESENT ILLNESS:   Ms. Hechavarria, a 70 y.o. female, was seen for a [facility] cancer genetics consultation at the request of Dr. Zella Richer due to a personal history of breast cancer and family history of breast, ovarian, pancreatic, and other cancers.  Ms. Depoy presents to clinic today with her daughter-in-law to discuss the possibility of a hereditary predisposition to cancer, genetic testing, and to further clarify her future cancer risks, as well as potential cancer risks for family members.   In March 2017, at the age of 67, Ms. Vasbinder was diagnosed with invasive ductal carcinoma of the left breast.  Hormone receptor status was ER+, PR-, and Her2-. Genetic testing will help inform surgical and treatment decisions.   CANCER HISTORY:    Breast cancer of upper-outer quadrant of left female breast ([place])   12/30/2015 Initial Diagnosis Mammogram: Architectural distortion with spiculated and indistinct margin left breast central, 1 cm mass; biopsy: Invasive ductal carcinoma grade 1-2, with DCIS, LVID present, ER positive PR negative HER-2 negative Ki-67 5%, T1BN0 stage IA clinical stage     HORMONAL RISK FACTORS:  Menarche was at age 11.  First live birth at age 48.  OCP use for approximately 0 years.  Ovaries intact: yes.  Hysterectomy: no.  Menopausal status: postmenopausal.  HRT use: 0 years. Colonoscopy: yes; Most recent was w/in last year - 1 polyp was removed; hx of  12 other polyps per patient report. Mammogram within the last year: was receiving mammograms every other year. Number of breast biopsies: 1. Up to date with pelvic exams:  Last was 5 years ago and was normal. Any excessive radiation exposure in the past:  no  Past Medical History  Diagnosis Date  . Hypertension   . Wears glasses     Past Surgical History  Procedure Laterality Date  . Tonsillectomy    . Breast enhancement surgery  [id number]    implants  . Tubal ligation    . Ulnar collateral ligament repair Left 08/18/2013    Procedure: LEFT THUMB ULNAR COLLATERAL LIGAMENT REPAIR;  Surgeon: Tennis Must, MD;  Location: [facility];  Service: Orthopedics;  Laterality: Left;    Social History   Social History  . Marital Status: Single    Spouse Name: N/A  . Number of Children: N/A  . Years of Education: N/A   Social History Main Topics  . Smoking status: Former Smoker -- 1.00 packs/day for 15 years    Types: Cigarettes    Quit date: 08/13/1997  . Smokeless tobacco: Never Used     Comment: max 1 ppd  . Alcohol Use: 3.0 - 4.2 oz/week    5-7 Glasses of wine per week     Comment: mixed drinks or wine  . Drug Use: No  . Sexual Activity: Not Asked   Other Topics Concern  . None   Social History Narrative  FAMILY HISTORY:  We obtained a detailed, 4-generation family history.  Significant diagnoses are listed below: Family History  Problem Relation Age of Onset  . Skin cancer Mother     NOS; dx. late 77s; outside a lot  . Other Mother     history of hysterectomy in her mid-late 38s for unspecified reason  . Pancreatic cancer Father 41  . Esophageal cancer Brother 28    +tobacco, +EtOH  . Stroke Maternal Uncle   . Cancer Maternal Uncle     NOS; d. late 55s  . Breast cancer Maternal Aunt     d. mid-40s  . Breast cancer Maternal Aunt     dx. 54s  . Ovarian cancer Maternal Aunt     dx. after breast cancer (60s+)  . Ovarian cancer Maternal Aunt      d. late 65s  . Other Maternal Aunt     had a "hump on her back"  . Pancreatic cancer Cousin     maternal 1st cousin dx. 59s  . Ovarian cancer Cousin     maternal 1st cousin dx. late 32s    Ms. Flegal has two sons, ages 37 and 51, and one daughter who is 35.  Ms. Janowski has two full brothers and one full sister--ages 50, 34, and 71.  Her oldest brother was recently diagnosed with esophageal cancer at 72; he has a history of tobacco and alcohol use.  Ms. Lucy's mother passed away at the age of 63.  She had a history of skin cancer, diagnosed in her late 29s; Ms. Fernholz reports that she was outside in the sun a lot.  Ms. Imparato mother also had a history of a hysterectomy in her mid-late 83s.  Ms. Eiland father died from a "work-related" cause at the age of 51.  He was diagnosed with pancreatic cancer at the age of 85.    Ms. Monica mother had six full sisters and two full brothers.  One sister is still living at the age of 86.  One sister died of breast cancer in her mid-42s.  She had two sons, neither of whom have had cancer.  Another sister was diagnosed with breast cancer in her 5s, and then was diagnosed with and passed away from ovarian cancer after.  She had five sons, none of whom have ever had cancer.  A third sister died of ovarian cancer in her late 41s.  One sister died in childhood from an unspecified cause.  The last sister passed away in her 68s and had no children of her own.  One brother died of an unspecified type of cancer in his late 67s.  The other uncle died of a stroke in his mid-65s.  He had two daughters--one daughter died of pancreatic cancer in her 36s and the other daughter died of ovarian cancer in her late 68s.  Ms. Bittinger maternal grandmother died at the age of 65.  Ms. Vieyra maternal grandfather died at 76-95.  Ms. Dohn has no information for any maternal great aunts/uncles or great grandparents.    Ms. Lemmons father had approximately 33 brothers and  sisters--6 of whom were from three sets of twins.  Ms. Blankenhorn had no further information for these relatives, as they were all much older than her.  Ms. Kaczmarczyk's paternal grandmother died at an "older age".  Her paternal grandfather died before Ms. Estrin was born.  She has no further information for her paternal relatives.  Patient's maternal ancestors are of Caucasian  descent, and paternal ancestors are of Caucasian/Native American - [place] descent. There is no reported Ashkenazi Jewish ancestry. There is no known consanguinity.  GENETIC COUNSELING ASSESSMENT: JURI DINNING is a 70 y.o. female with a personal and family history of cancer which is somewhat suggestive of a hereditary cancer syndrome and predisposition to cancer. We, therefore, discussed and recommended the following at today's visit.   DISCUSSION: We reviewed the characteristics, features and inheritance patterns of hereditary cancer syndromes, particularly those caused by mutations within the BRCA1/2 and Lynch syndrome genes. We also discussed genetic testing, including the appropriate family members to test, the process of testing, insurance coverage and turn-around-time for results. We discussed the implications of a negative, positive and/or variant of uncertain significant result. Based on the family and personal history of cancer as well as Ms. Bazzi's personal history of multiple colon polyps, we recommended Ms. Sainz pursue genetic testing for the 32-gene Comprehensive Cancer Panel through [facility] in [place], [place].  The Comprehensive Cancer Panel offered by [facility] includes sequencing and/or deletion duplication testing of the following 32 genes: APC, ATM, AXIN2, BARD1, BMPR1A, BRCA1, BRCA2, BRIP1, CDH1, CDK4, CDKN2A, CHEK2, EPCAM, FANCC, MLH1, MSH2, MSH6, MUTYH, NBN, PALB2, PMS2, POLD1, POLE, PTEN, RAD51C, RAD51D, SCG5/GREM1, SMAD4, STK11, TP53, VHL, and XRCC2.  MSH2 Exons 1-7 Inversion Analysis will also be  included.   Based on Ms. Weingartner's personal and family history of cancer, she meets medical criteria for genetic testing. Despite that she meets criteria, she may still have an out of pocket cost. We discussed that if her out of pocket cost for testing is over $100, the laboratory will call and confirm whether she wants to proceed with testing.  If the out of pocket cost of testing is less than $100 she will be billed by the genetic testing laboratory.   PLAN: After considering the risks, benefits, and limitations, Ms. Kalisz  provided informed consent to pursue genetic testing and the blood sample was sent to [facility] for analysis of the 32-gene Comprehensive Cancer Panel. Results should be available within approximately 2-3 weeks' time, at which point they will be disclosed by telephone to Ms. Meda Coffee, as will any additional recommendations warranted by these results. Ms. Osmanovic will receive a summary of her genetic counseling visit and a copy of her results once available. This information will also be available in Epic. We encouraged Ms. Boyett to remain in contact with cancer genetics annually so that we can continuously update the family history and inform her of any changes in cancer genetics and testing that may be of benefit for her family. Ms. Lopata questions were answered to her satisfaction today. Our contact information was provided should additional questions or concerns arise.  Thank you for the referral and allowing [place] to share in the care of your patient.   Jeanine Luz, MS, [facility] Certified Genetic Counselor [email or web address] Phone: [phone]  The patient was seen for a total of 60 minutes in face-to-face genetic counseling.  This patient was discussed with Drs. Magrinat, Lindi Adie and/or Burr Medico who agrees with the above.    _______________________________________________________________________ For Office Staff:  Number of people involved in session: 2 Was an  Intern/ student involved with case: no

## 2016-02-03 ENCOUNTER — Encounter: Payer: Self-pay | Admitting: Radiation Oncology

## 2016-02-03 ENCOUNTER — Ambulatory Visit
Admission: RE | Admit: 2016-02-03 | Discharge: 2016-02-03 | Disposition: A | Discharge status: Home / Self Care | Payer: Medicare Other | Source: Ambulatory Visit | Attending: Radiation Oncology | Admitting: Radiation Oncology

## 2016-02-03 VITALS — BP 108/54 | HR 76 | Temp 97.8°F | Ht 63.5 in | Wt 157.4 lb

## 2016-02-03 DIAGNOSIS — C50412 Malignant neoplasm of upper-outer quadrant of left female breast: Secondary | ICD-10-CM

## 2016-02-03 DIAGNOSIS — Z87891 Personal history of nicotine dependence: Secondary | ICD-10-CM | POA: Diagnosis not present

## 2016-02-03 DIAGNOSIS — Z803 Family history of malignant neoplasm of breast: Secondary | ICD-10-CM | POA: Diagnosis not present

## 2016-02-03 DIAGNOSIS — Z800 Family history of malignant neoplasm of digestive organs: Secondary | ICD-10-CM | POA: Insufficient documentation

## 2016-02-03 DIAGNOSIS — Z510 Encounter for antineoplastic radiation therapy: Secondary | ICD-10-CM | POA: Diagnosis not present

## 2016-02-03 DIAGNOSIS — Z8041 Family history of malignant neoplasm of ovary: Secondary | ICD-10-CM | POA: Diagnosis not present

## 2016-02-03 DIAGNOSIS — Z170 Estrogen receptor positive status [ER+]: Secondary | ICD-10-CM | POA: Diagnosis not present

## 2016-02-03 DIAGNOSIS — I10 Essential (primary) hypertension: Secondary | ICD-10-CM | POA: Insufficient documentation

## 2016-02-03 NOTE — Progress Notes (Signed)
Please see the Nurse Progress Note in the MD Initial Consult Encounter for this patient. 

## 2016-02-03 NOTE — Progress Notes (Signed)
Radiation Oncology         [phone] ________________________________  Initial Outpatient Consultation  Name: Stacey Doyle MRN: [id number]  Date: 02/03/2016  DOB: 10/31/1946  PN:TIRWERX W Tisovec, MD  Tisovec, [name], MD   REFERRING PHYSICIAN: Tisovec, [name], MD  DIAGNOSIS: T1BN0, stage IA Invasive ductal carcinoma of the left breast     Breast cancer of upper-outer quadrant of left female breast ([place])   12/30/2015 Initial Diagnosis Mammogram: Architectural distortion with spiculated and indistinct margin left breast central, 1 cm mass; biopsy: Invasive ductal carcinoma grade 1-2, with DCIS, LVID present, ER positive PR negative HER-2 negative Ki-67 5%, T1BN0 stage IA clinical stage    HISTORY OF PRESENT ILLNESS: Stacey Doyle is a 70 y.o. female Who is seen out of the courtesy of Dr. Zella Richer for an  opinion concerning radiation therapy as part of management of patient's clinical stage I left breast cancer.  She has a  recent diagnosis of left breast cancer. She had a routine screening mammogram that revealed architectural distortion the left breast. This was further investigated by diagnostic mammograms and breast ultrasound. The ultrasound revealed area for  distortion measured 1 cm in size. She subsequently underwent ultrasound-guided biopsy of this mass which revealed invasive ductal carcinoma with DCIS that was ER positive PR negative HER-2 negative with a Ki-67 of 5%. There was also evidence of lymphovascular invasion. The patient met with Dr.Rosenbower, plans for surgery as not yet been made.   In terms of breast cancer risk profile:   She menarched at early age of 26 and went to menopause at age 76  She had 2 pregnancy, her first child was born at age 49  She has received birth control pills for approximately 5 years.  She was never exposed to fertility medications or hormone replacement therapy.  She has significant family history of Breast/GYN/GI cancer Aunt  breast cancer in the 8s Aunt breast cancer in the 49s Another aunt with ovarian cancer in 74s Maternal cousin pancreatic cancer in 5s Maternal cousin ovarian cancer in the 53s  PREVIOUS RADIATION THERAPY: No  PAST MEDICAL HISTORY:  has a past medical history of Hypertension and Wears glasses.    PAST SURGICAL HISTORY: Past Surgical History  Procedure Laterality Date  . Tonsillectomy    . Breast enhancement surgery  [id number]    implants  . Tubal ligation    . Ulnar collateral ligament repair Left 08/18/2013    Procedure: LEFT THUMB ULNAR COLLATERAL LIGAMENT REPAIR;  Surgeon: Tennis Must, MD;  Location: [facility];  Service: Orthopedics;  Laterality: Left;    FAMILY HISTORY: family history includes Breast cancer in her maternal aunt and maternal aunt; Cancer in her maternal uncle; Esophageal cancer (age of onset: 17) in her brother; Other in her maternal aunt and mother; Ovarian cancer in her cousin, maternal aunt, and maternal aunt; Pancreatic cancer in her cousin; Pancreatic cancer (age of onset: 66) in her father; Skin cancer in her mother; Stroke in her maternal uncle.  SOCIAL HISTORY:  reports that she quit smoking about 18 years ago. Her smoking use included Cigarettes. She has a 15 pack-year smoking history. She has never used smokeless tobacco. She reports that she drinks about 3.0 - 4.2 oz of alcohol per week. She reports that she does not use illicit drugs.  ALLERGIES: Percocet  MEDICATIONS:  Current Outpatient Prescriptions  Medication Sig Dispense Refill  . atorvastatin (LIPITOR) 10 MG tablet Take 10 mg by mouth  daily.    . calcium carbonate (OS-CAL - DOSED IN MG OF ELEMENTAL CALCIUM) 1250 MG tablet Take 1 tablet by mouth.    . cholecalciferol (VITAMIN D) 1000 UNITS tablet Take 1,000 Units by mouth daily.    . clonazePAM (KLONOPIN) 0.5 MG tablet Take 0.5 mg by mouth daily.    Marland Kitchen glucosamine-chondroitin 500-400 MG tablet Take 1 tablet by mouth 3 (three)  times daily.    Marland Kitchen olmesartan (BENICAR) 20 MG tablet Take 20 mg by mouth daily.    . vitamin C (ASCORBIC ACID) 250 MG tablet Take 250 mg by mouth daily.     No current facility-administered medications for this encounter.   REVIEW OF SYSTEMS:  A 15 point review of systems is documented in the electronic medical record. This was obtained by the nursing staff. However, I reviewed this with the patient to discuss relevant findings and make appropriate changes.  Pertinent items are noted in HPI. No reports of pain within either breast nipple discharge or bleeding prior to diagnosis   PHYSICAL EXAM:  height is 5' 3.5" (1.613 m) and weight is 157 lb 6.4 oz (71.396 kg). Her oral temperature is 97.8 F (36.6 C). Her blood pressure is 108/54 and her pulse is 76.   General: Alert and oriented, in no acute distress HEENT: Head is normocephalic. Extraocular movements are intact. Oropharynx is clear. Neck: Neck is supple, no palpable cervical or supraclavicular lymphadenopathy. Heart: Regular in rate and rhythm with no murmurs, rubs, or gallops. Chest: Clear to auscultation bilaterally, with no rhonchi, wheezes, or rales. Abdomen: Soft, nontender, nondistended, with no rigidity or guarding. Extremities: No cyanosis or edema. Lymphatics: see Neck Exam Skin: No concerning lesions. Musculoskeletal: symmetric strength and muscle tone throughout. Neurologic: Cranial nerves II through XII are grossly intact. No obvious focalities. Speech is fluent. Coordination is intact. Psychiatric: Judgment and insight are intact. Affect is appropriate. Breast: Palpable bilateral implants in place. Small puncture in upper outer quadrant in left breast. No palpable mass, nipple discharge or bleeding on left. Right breast is unremarkable. Marland Kitchen  ECOG = 0  LABORATORY DATA:  No results found for: WBC, HGB, HCT, MCV, PLT, NEUTROABS Lab Results  Component Value Date   NA 136 08/14/2013   K 4.0 08/14/2013   CL 97 08/14/2013    CO2 29 08/14/2013   GLUCOSE 85 08/14/2013   CREATININE 0.67 08/14/2013   CALCIUM 9.8 08/14/2013   RADIOGRAPHY: No results found. Reports from [place] reviewed    IMPRESSION: The patient has been diagnosed with grade 1-2 invasive ductal carcinoma of the left breast. A decision has not been made regarding lumpectomy versus mastectomy as she is awaiting the results of her genetic testing. The patient is likely to benefit from radiation treatment to the left breast.   PLAN:We discussed the possible side effects and risks of treatment in addition to the possible benefits of treatment. We discussed the protocol for radiation treatment.  All of the patient's questions were answered. We discussed consideration for removal of her breast implants at the time of her surgery. We discussed the risk for development of fibrosis if her implants were to remain during radiation treatment.  She may benefit from consultation with a Psychiatric nurse.  Follow up in RadOnc after surgery. The patient would like to talk with her plastic surgeon Dr. Luvenia Heller and we will make a referral today for this consultation.  Recommendations: 1. Breast conserving surgery followed by 2. Oncotype DX testing to determine if chemotherapy would be  of any benefit followed by 3. Adjuvant radiation therapy followed by 4. Adjuvant antiestrogen therapy     ------------------------------------------------  Blair Promise, PhD, MD  This document serves as a record of services personally performed by Gery Pray, MD. It was created on his behalf by Derek Mound, a trained medical scribe. The creation of this record is based on the scribe's personal observations and the provider's statements to them. This document has been checked and approved by the attending provider.

## 2016-02-04 NOTE — Addendum Note (Signed)
Encounter addended by: Jacqulyn Liner, RN on: 02/04/2016 10:41 AM<BR>     Documentation filed: Charges VN

## 2016-02-09 ENCOUNTER — Telehealth: Payer: Self-pay | Admitting: Genetic Counselor

## 2016-02-09 NOTE — Telephone Encounter (Signed)
Discussed with Ms. Klausner that the first part of her genetic testing was negative for pathogenic mutations within any of 8 genes for which mutations in these can increase breast cancer risks high-moderate.  Genes included ATM, BRCA1, BRCA2, CDH1, CHEK2, PALB2, PTEN, and TP53.  Additionally, no uncertain changes were found in any of these genes.  Discussed that there are still 24 other genes to be tested--which will look for mutations that can also somewhat increase breast cancer risks, increase ovarian cancer risks, and/or increase colon and other related cancer risks.  Thus, there is still a possibility that we may get a positive result.  Also discussed that, even if we get a totally negative result that there is still some important family history to consider as well (thinking about her maternal aunts and first cousins with breast, ovarian, or pancreatic cancers).  We will discuss her risks more once her final test result comes back.  Ms. Yeakel is happy to receive this news so far.  I will let her doctors know that this part of testing has come back, that we are still waiting on additional test result, but, hopefully this will be helpful to treatment planning in the event that the remainder of testing is not back within the desired time frame.  Ms. Benninger knows that she is welcome to call me with any questions.

## 2016-02-14 DIAGNOSIS — D0512 Intraductal carcinoma in situ of left breast: Secondary | ICD-10-CM | POA: Diagnosis not present

## 2016-02-16 ENCOUNTER — Telehealth: Payer: Self-pay | Admitting: Genetic Counselor

## 2016-02-16 NOTE — Telephone Encounter (Signed)
Discussed with Stacey Doyle that her genetic test results were negative for known pathogenic mutations within any of 32 genes that would cause her to be at an increased genetic risk for breast, ovarian, pancreatic, colon, or other related cancers or colon polyps.  Two uncertain changes were found--one on the MUTYH gene and one on the POLE gene.  Discussed that we treated these just like negative results, and reviewed why we do that.  For both of these variants, at least one lab is calling them 'likely benign' changes.  Encouraged Stacey Doyle to keep her phone number up-to-date with [place], so that we can call and update her, if the lab updates the status of these variants in the future.  Discussed that Stacey Doyle should continue to follow her doctors' recommendations for future cancer screening.  Women in the family are at a somewhat increased risk for breast and ovarian cancer, just based on the family history.  Discussed that they should make their doctors' aware of this history, and they should continue to have annual mammogram screening and clinical breast exams.  Stacey Doyle is happy to receive this news, she has her surgery next week.  She is welcome to call or email with any questions.

## 2016-02-17 ENCOUNTER — Encounter: Payer: Self-pay | Admitting: *Deleted

## 2016-02-17 ENCOUNTER — Ambulatory Visit: Payer: Self-pay | Admitting: Genetic Counselor

## 2016-02-17 ENCOUNTER — Telehealth: Payer: Self-pay | Admitting: Hematology and Oncology

## 2016-02-17 ENCOUNTER — Encounter (HOSPITAL_BASED_OUTPATIENT_CLINIC_OR_DEPARTMENT_OTHER): Payer: Self-pay | Admitting: *Deleted

## 2016-02-17 DIAGNOSIS — Z8041 Family history of malignant neoplasm of ovary: Secondary | ICD-10-CM

## 2016-02-17 DIAGNOSIS — Z1379 Encounter for other screening for genetic and chromosomal anomalies: Secondary | ICD-10-CM

## 2016-02-17 DIAGNOSIS — Z803 Family history of malignant neoplasm of breast: Secondary | ICD-10-CM

## 2016-02-17 DIAGNOSIS — Z800 Family history of malignant neoplasm of digestive organs: Secondary | ICD-10-CM

## 2016-02-17 DIAGNOSIS — C50412 Malignant neoplasm of upper-outer quadrant of left female breast: Secondary | ICD-10-CM

## 2016-02-17 DIAGNOSIS — Z86010 Personal history of colonic polyps: Secondary | ICD-10-CM

## 2016-02-17 NOTE — Telephone Encounter (Signed)
lvm for pt regarding to April appt.... °

## 2016-02-18 ENCOUNTER — Encounter (HOSPITAL_BASED_OUTPATIENT_CLINIC_OR_DEPARTMENT_OTHER)
Admission: RE | Admit: 2016-02-18 | Discharge: 2016-02-18 | Disposition: A | Discharge status: Home / Self Care | Payer: Medicare Other | Source: Ambulatory Visit | Attending: General Surgery | Admitting: General Surgery

## 2016-02-18 ENCOUNTER — Ambulatory Visit
Admission: RE | Admit: 2016-02-18 | Discharge: 2016-02-18 | Disposition: A | Discharge status: Home / Self Care | Payer: Medicare Other | Source: Ambulatory Visit | Attending: General Surgery | Admitting: General Surgery

## 2016-02-18 DIAGNOSIS — Z8041 Family history of malignant neoplasm of ovary: Secondary | ICD-10-CM | POA: Diagnosis not present

## 2016-02-18 DIAGNOSIS — Z170 Estrogen receptor positive status [ER+]: Secondary | ICD-10-CM | POA: Diagnosis not present

## 2016-02-18 DIAGNOSIS — Z124 Encounter for screening for malignant neoplasm of cervix: Secondary | ICD-10-CM | POA: Diagnosis not present

## 2016-02-18 DIAGNOSIS — Z803 Family history of malignant neoplasm of breast: Secondary | ICD-10-CM | POA: Diagnosis not present

## 2016-02-18 DIAGNOSIS — I10 Essential (primary) hypertension: Secondary | ICD-10-CM | POA: Diagnosis not present

## 2016-02-18 DIAGNOSIS — Z87891 Personal history of nicotine dependence: Secondary | ICD-10-CM | POA: Diagnosis not present

## 2016-02-18 DIAGNOSIS — C50412 Malignant neoplasm of upper-outer quadrant of left female breast: Secondary | ICD-10-CM | POA: Diagnosis not present

## 2016-02-18 DIAGNOSIS — Z01812 Encounter for preprocedural laboratory examination: Secondary | ICD-10-CM | POA: Diagnosis not present

## 2016-02-18 DIAGNOSIS — C50919 Malignant neoplasm of unspecified site of unspecified female breast: Secondary | ICD-10-CM | POA: Diagnosis not present

## 2016-02-18 DIAGNOSIS — Z510 Encounter for antineoplastic radiation therapy: Secondary | ICD-10-CM | POA: Diagnosis not present

## 2016-02-18 DIAGNOSIS — Z800 Family history of malignant neoplasm of digestive organs: Secondary | ICD-10-CM | POA: Diagnosis not present

## 2016-02-18 DIAGNOSIS — Z1151 Encounter for screening for human papillomavirus (HPV): Secondary | ICD-10-CM | POA: Diagnosis not present

## 2016-02-18 DIAGNOSIS — Z01818 Encounter for other preprocedural examination: Secondary | ICD-10-CM | POA: Diagnosis not present

## 2016-02-18 DIAGNOSIS — Z6826 Body mass index (BMI) 26.0-26.9, adult: Secondary | ICD-10-CM | POA: Diagnosis not present

## 2016-02-18 LAB — CBC WITH DIFFERENTIAL/PLATELET
Basophils Absolute: 0 10*3/uL (ref 0.0–0.1)
Basophils Relative: 0 %
EOS ABS: 0 10*3/uL (ref 0.0–0.7)
Eosinophils Relative: 1 %
HCT: 39.2 % (ref 36.0–46.0)
HEMOGLOBIN: 12.3 g/dL (ref 12.0–15.0)
LYMPHS ABS: 2.6 10*3/uL (ref 0.7–4.0)
LYMPHS PCT: 55 %
MCH: 29.9 pg (ref 26.0–34.0)
MCHC: 31.4 g/dL (ref 30.0–36.0)
MCV: 95.4 fL (ref 78.0–100.0)
MONOS PCT: 6 %
Monocytes Absolute: 0.3 10*3/uL (ref 0.1–1.0)
NEUTROS PCT: 38 %
Neutro Abs: 1.8 10*3/uL (ref 1.7–7.7)
Platelets: 161 10*3/uL (ref 150–400)
RBC: 4.11 MIL/uL (ref 3.87–5.11)
RDW: 13.2 % (ref 11.5–15.5)
WBC: 4.8 10*3/uL (ref 4.0–10.5)

## 2016-02-18 LAB — COMPREHENSIVE METABOLIC PANEL
ALK PHOS: 48 U/L (ref 38–126)
ALT: 36 U/L (ref 14–54)
ANION GAP: 11 (ref 5–15)
AST: 37 U/L (ref 15–41)
Albumin: 4.1 g/dL (ref 3.5–5.0)
BILIRUBIN TOTAL: 0.8 mg/dL (ref 0.3–1.2)
BUN: 17 mg/dL (ref 6–20)
CALCIUM: 10.2 mg/dL (ref 8.9–10.3)
CO2: 28 mmol/L (ref 22–32)
CREATININE: 0.78 mg/dL (ref 0.44–1.00)
Chloride: 106 mmol/L (ref 101–111)
Glucose, Bld: 103 mg/dL — ABNORMAL HIGH (ref 65–99)
Potassium: 4.6 mmol/L (ref 3.5–5.1)
Sodium: 145 mmol/L (ref 135–145)
TOTAL PROTEIN: 6.6 g/dL (ref 6.5–8.1)

## 2016-02-21 DIAGNOSIS — C50412 Malignant neoplasm of upper-outer quadrant of left female breast: Secondary | ICD-10-CM | POA: Diagnosis not present

## 2016-02-24 ENCOUNTER — Encounter (HOSPITAL_BASED_OUTPATIENT_CLINIC_OR_DEPARTMENT_OTHER)
Admission: RE | Disposition: A | Discharge status: Home / Self Care | Payer: Self-pay | Source: Ambulatory Visit | Attending: General Surgery

## 2016-02-24 ENCOUNTER — Ambulatory Visit (HOSPITAL_BASED_OUTPATIENT_CLINIC_OR_DEPARTMENT_OTHER): Payer: Medicare Other | Admitting: Anesthesiology

## 2016-02-24 ENCOUNTER — Ambulatory Visit (HOSPITAL_BASED_OUTPATIENT_CLINIC_OR_DEPARTMENT_OTHER)
Admission: RE | Admit: 2016-02-24 | Discharge: 2016-02-24 | Disposition: A | Discharge status: Home / Self Care | Payer: Medicare Other | Source: Ambulatory Visit | Attending: General Surgery | Admitting: General Surgery

## 2016-02-24 ENCOUNTER — Ambulatory Visit (HOSPITAL_COMMUNITY)
Admission: RE | Admit: 2016-02-24 | Discharge: 2016-02-24 | Disposition: A | Discharge status: Home / Self Care | Payer: Medicare Other | Source: Ambulatory Visit | Attending: General Surgery | Admitting: General Surgery

## 2016-02-24 ENCOUNTER — Encounter (HOSPITAL_BASED_OUTPATIENT_CLINIC_OR_DEPARTMENT_OTHER): Payer: Self-pay | Admitting: Anesthesiology

## 2016-02-24 DIAGNOSIS — Z87891 Personal history of nicotine dependence: Secondary | ICD-10-CM | POA: Insufficient documentation

## 2016-02-24 DIAGNOSIS — C50412 Malignant neoplasm of upper-outer quadrant of left female breast: Secondary | ICD-10-CM | POA: Diagnosis not present

## 2016-02-24 DIAGNOSIS — C773 Secondary and unspecified malignant neoplasm of axilla and upper limb lymph nodes: Secondary | ICD-10-CM | POA: Insufficient documentation

## 2016-02-24 DIAGNOSIS — Z885 Allergy status to narcotic agent status: Secondary | ICD-10-CM | POA: Diagnosis not present

## 2016-02-24 DIAGNOSIS — Z79899 Other long term (current) drug therapy: Secondary | ICD-10-CM | POA: Diagnosis not present

## 2016-02-24 DIAGNOSIS — Z803 Family history of malignant neoplasm of breast: Secondary | ICD-10-CM | POA: Diagnosis not present

## 2016-02-24 DIAGNOSIS — I10 Essential (primary) hypertension: Secondary | ICD-10-CM | POA: Diagnosis not present

## 2016-02-24 DIAGNOSIS — F419 Anxiety disorder, unspecified: Secondary | ICD-10-CM | POA: Insufficient documentation

## 2016-02-24 DIAGNOSIS — G8918 Other acute postprocedural pain: Secondary | ICD-10-CM | POA: Diagnosis not present

## 2016-02-24 DIAGNOSIS — R079 Chest pain, unspecified: Secondary | ICD-10-CM | POA: Diagnosis not present

## 2016-02-24 DIAGNOSIS — C50912 Malignant neoplasm of unspecified site of left female breast: Secondary | ICD-10-CM | POA: Insufficient documentation

## 2016-02-24 DIAGNOSIS — D0512 Intraductal carcinoma in situ of left breast: Secondary | ICD-10-CM | POA: Diagnosis not present

## 2016-02-24 DIAGNOSIS — C50919 Malignant neoplasm of unspecified site of unspecified female breast: Secondary | ICD-10-CM

## 2016-02-24 HISTORY — PX: RADIOACTIVE SEED GUIDED PARTIAL MASTECTOMY WITH AXILLARY SENTINEL LYMPH NODE BIOPSY: SHX6520

## 2016-02-24 HISTORY — DX: Anxiety disorder, unspecified: F41.9

## 2016-02-24 SURGERY — RADIOACTIVE SEED GUIDED PARTIAL MASTECTOMY WITH AXILLARY SENTINEL LYMPH NODE BIOPSY
Anesthesia: General | Site: Breast | Laterality: Left

## 2016-02-24 MED ORDER — PROPOFOL 10 MG/ML IV BOLUS
INTRAVENOUS | Status: AC
  Filled 2016-02-24: qty 40

## 2016-02-24 MED ORDER — DEXAMETHASONE SODIUM PHOSPHATE 10 MG/ML IJ SOLN
INTRAMUSCULAR | Status: AC
  Filled 2016-02-24: qty 1

## 2016-02-24 MED ORDER — MIDAZOLAM HCL 2 MG/2ML IJ SOLN
1.0000 mg | INTRAMUSCULAR | Status: DC | PRN
  Administered 2016-02-24 (×2): 1 mg via INTRAVENOUS

## 2016-02-24 MED ORDER — IOPAMIDOL (ISOVUE-300) INJECTION 61%
INTRAVENOUS | Status: AC
  Filled 2016-02-24: qty 50

## 2016-02-24 MED ORDER — HEPARIN (PORCINE) IN NACL 2-0.9 UNIT/ML-% IJ SOLN
INTRAMUSCULAR | Status: AC
  Filled 2016-02-24: qty 500

## 2016-02-24 MED ORDER — ONDANSETRON HCL 4 MG/2ML IJ SOLN
INTRAMUSCULAR | Status: AC
  Filled 2016-02-24: qty 2

## 2016-02-24 MED ORDER — METHYLENE BLUE 0.5 % INJ SOLN
INTRAVENOUS | Status: AC
  Filled 2016-02-24: qty 10

## 2016-02-24 MED ORDER — SCOPOLAMINE 1 MG/3DAYS TD PT72: 1.0000 | MEDICATED_PATCH | Freq: Once | TRANSDERMAL | Status: DC | PRN

## 2016-02-24 MED ORDER — BUPIVACAINE-EPINEPHRINE (PF) 0.5% -1:200000 IJ SOLN
INTRAMUSCULAR | Status: DC | PRN
  Administered 2016-02-24: 30 mL via PERINEURAL

## 2016-02-24 MED ORDER — LIDOCAINE HCL (CARDIAC) 20 MG/ML IV SOLN
INTRAVENOUS | Status: AC
  Filled 2016-02-24: qty 5

## 2016-02-24 MED ORDER — FENTANYL CITRATE (PF) 100 MCG/2ML IJ SOLN
50.0000 ug | INTRAMUSCULAR | Status: DC | PRN
  Administered 2016-02-24 (×2): 50 ug via INTRAVENOUS

## 2016-02-24 MED ORDER — LACTATED RINGERS IV SOLN
INTRAVENOUS | Status: DC
  Administered 2016-02-24 (×3): via INTRAVENOUS

## 2016-02-24 MED ORDER — MIDAZOLAM HCL 5 MG/5ML IJ SOLN
INTRAMUSCULAR | Status: DC | PRN
  Administered 2016-02-24: 2 mg via INTRAVENOUS

## 2016-02-24 MED ORDER — LIDOCAINE HCL (PF) 1 % IJ SOLN
INTRAMUSCULAR | Status: AC
  Filled 2016-02-24: qty 30

## 2016-02-24 MED ORDER — PROPOFOL 10 MG/ML IV BOLUS
INTRAVENOUS | Status: DC | PRN
  Administered 2016-02-24: 150 mg via INTRAVENOUS

## 2016-02-24 MED ORDER — PHENYLEPHRINE HCL 10 MG/ML IJ SOLN
INTRAMUSCULAR | Status: DC | PRN
  Administered 2016-02-24: 80 ug via INTRAVENOUS
  Administered 2016-02-24: 40 ug via INTRAVENOUS

## 2016-02-24 MED ORDER — DEXAMETHASONE SODIUM PHOSPHATE 4 MG/ML IJ SOLN
INTRAMUSCULAR | Status: DC | PRN
  Administered 2016-02-24: 10 mg via INTRAVENOUS

## 2016-02-24 MED ORDER — CEFAZOLIN SODIUM 1 G IJ SOLR
INTRAMUSCULAR | Status: AC
  Filled 2016-02-24: qty 20

## 2016-02-24 MED ORDER — PROPOFOL 10 MG/ML IV BOLUS
INTRAVENOUS | Status: AC
  Filled 2016-02-24: qty 20

## 2016-02-24 MED ORDER — PHENYLEPHRINE 40 MCG/ML (10ML) SYRINGE FOR IV PUSH (FOR BLOOD PRESSURE SUPPORT)
PREFILLED_SYRINGE | INTRAVENOUS | Status: AC
  Filled 2016-02-24: qty 10

## 2016-02-24 MED ORDER — ONDANSETRON HCL 4 MG/2ML IJ SOLN
INTRAMUSCULAR | Status: DC | PRN
Start: 2016-02-24 — End: 2016-02-24
  Administered 2016-02-24: 4 mg via INTRAVENOUS

## 2016-02-24 MED ORDER — TECHNETIUM TC 99M SULFUR COLLOID FILTERED
1.0000 | Freq: Once | INTRAVENOUS | Status: AC | PRN
  Administered 2016-02-24: 1 via INTRADERMAL

## 2016-02-24 MED ORDER — FENTANYL CITRATE (PF) 100 MCG/2ML IJ SOLN
INTRAMUSCULAR | Status: AC
  Filled 2016-02-24: qty 2

## 2016-02-24 MED ORDER — BUPIVACAINE-EPINEPHRINE (PF) 0.5% -1:200000 IJ SOLN
INTRAMUSCULAR | Status: AC
  Filled 2016-02-24: qty 30

## 2016-02-24 MED ORDER — BUPIVACAINE HCL 0.5 % IJ SOLN
INTRAMUSCULAR | Status: DC | PRN
  Administered 2016-02-24: 10 mL

## 2016-02-24 MED ORDER — GLYCOPYRROLATE 0.2 MG/ML IJ SOLN: 0.2000 mg | Freq: Once | INTRAMUSCULAR | Status: DC | PRN

## 2016-02-24 MED ORDER — CEFAZOLIN SODIUM-DEXTROSE 2-4 GM/100ML-% IV SOLN
INTRAVENOUS | Status: AC
  Filled 2016-02-24: qty 100

## 2016-02-24 MED ORDER — ARTIFICIAL TEARS OP OINT
TOPICAL_OINTMENT | OPHTHALMIC | Status: AC
  Filled 2016-02-24: qty 3.5

## 2016-02-24 MED ORDER — HEPARIN SOD (PORK) LOCK FLUSH 100 UNIT/ML IV SOLN
INTRAVENOUS | Status: AC
  Filled 2016-02-24: qty 5

## 2016-02-24 MED ORDER — MIDAZOLAM HCL 2 MG/2ML IJ SOLN
INTRAMUSCULAR | Status: AC
  Filled 2016-02-24: qty 2

## 2016-02-24 MED ORDER — ONDANSETRON HCL 4 MG/2ML IJ SOLN: 4.0000 mg | Freq: Once | INTRAMUSCULAR | Status: DC | PRN

## 2016-02-24 MED ORDER — SODIUM CHLORIDE 0.9 % IJ SOLN
INTRAMUSCULAR | Status: AC
  Filled 2016-02-24: qty 10

## 2016-02-24 MED ORDER — DEXTROSE 5 % IV SOLN
2.0000 g | INTRAVENOUS | Status: AC
  Administered 2016-02-24: 2 g via INTRAVENOUS

## 2016-02-24 MED ORDER — LIDOCAINE HCL (CARDIAC) 20 MG/ML IV SOLN
INTRAVENOUS | Status: DC | PRN
  Administered 2016-02-24: 50 mg via INTRAVENOUS

## 2016-02-24 MED ORDER — FENTANYL CITRATE (PF) 100 MCG/2ML IJ SOLN
INTRAMUSCULAR | Status: DC | PRN
  Administered 2016-02-24: 25 ug via INTRAVENOUS
  Administered 2016-02-24: 100 ug via INTRAVENOUS

## 2016-02-24 MED ORDER — FENTANYL CITRATE (PF) 100 MCG/2ML IJ SOLN: 25.0000 ug | INTRAMUSCULAR | Status: DC | PRN

## 2016-02-24 MED ORDER — BUPIVACAINE HCL (PF) 0.25 % IJ SOLN
INTRAMUSCULAR | Status: AC
  Filled 2016-02-24: qty 30

## 2016-02-24 MED ORDER — TRAMADOL HCL 50 MG PO TABS: 50.0000 mg | ORAL_TABLET | Freq: Four times a day (QID) | ORAL | Status: DC | PRN

## 2016-02-24 SURGICAL SUPPLY — 61 items
APL SKNCLS STERI-STRIP NONHPOA (GAUZE/BANDAGES/DRESSINGS) ×1
APPLIER CLIP 9.375 MED OPEN (MISCELLANEOUS) ×3
APR CLP MED 9.3 20 MLT OPN (MISCELLANEOUS) ×1
BENZOIN TINCTURE PRP APPL 2/3 (GAUZE/BANDAGES/DRESSINGS) ×3 IMPLANT
BINDER BREAST LRG (GAUZE/BANDAGES/DRESSINGS) ×2 IMPLANT
BINDER BREAST MEDIUM (GAUZE/BANDAGES/DRESSINGS) IMPLANT
BINDER BREAST XLRG (GAUZE/BANDAGES/DRESSINGS) IMPLANT
BINDER BREAST XXLRG (GAUZE/BANDAGES/DRESSINGS) IMPLANT
BLADE SURG 15 STRL LF DISP TIS (BLADE) ×1 IMPLANT
BLADE SURG 15 STRL SS (BLADE) ×3
CANISTER SUC SOCK COL 7IN (MISCELLANEOUS) IMPLANT
CANISTER SUCT 1200ML W/VALVE (MISCELLANEOUS) IMPLANT
CHLORAPREP W/TINT 26ML (MISCELLANEOUS) ×3 IMPLANT
CLIP APPLIE 9.375 MED OPEN (MISCELLANEOUS) IMPLANT
CLOSURE WOUND 1/2 X4 (GAUZE/BANDAGES/DRESSINGS) ×1
COVER BACK TABLE 60X90IN (DRAPES) ×3 IMPLANT
COVER MAYO STAND STRL (DRAPES) ×3 IMPLANT
COVER PROBE W GEL 5X96 (DRAPES) ×3 IMPLANT
DECANTER SPIKE VIAL GLASS SM (MISCELLANEOUS) IMPLANT
DEVICE DUBIN W/COMP PLATE 8390 (MISCELLANEOUS) ×3 IMPLANT
DRAPE LAPAROSCOPIC ABDOMINAL (DRAPES) ×3 IMPLANT
DRAPE UTILITY XL STRL (DRAPES) ×3 IMPLANT
ELECT COATED BLADE 2.86 ST (ELECTRODE) ×3 IMPLANT
ELECT REM PT RETURN 9FT ADLT (ELECTROSURGICAL) ×3
ELECTRODE REM PT RTRN 9FT ADLT (ELECTROSURGICAL) ×1 IMPLANT
GAUZE SPONGE 4X4 12PLY STRL (GAUZE/BANDAGES/DRESSINGS) ×3 IMPLANT
GLOVE BIO SURGEON STRL SZ 6.5 (GLOVE) ×1 IMPLANT
GLOVE BIO SURGEON STRL SZ7 (GLOVE) ×3 IMPLANT
GLOVE BIO SURGEONS STRL SZ 6.5 (GLOVE) ×1
GLOVE BIOGEL PI IND STRL 7.0 (GLOVE) IMPLANT
GLOVE BIOGEL PI IND STRL 7.5 (GLOVE) IMPLANT
GLOVE BIOGEL PI IND STRL 8 (GLOVE) ×1 IMPLANT
GLOVE BIOGEL PI INDICATOR 7.0 (GLOVE) ×4
GLOVE BIOGEL PI INDICATOR 7.5 (GLOVE) ×2
GLOVE BIOGEL PI INDICATOR 8 (GLOVE) ×2
GLOVE ECLIPSE 8.0 STRL XLNG CF (GLOVE) ×3 IMPLANT
GLOVE SURG SS PI 7.0 STRL IVOR (GLOVE) ×2 IMPLANT
GLOVE SURG SS PI 7.5 STRL IVOR (GLOVE) ×2 IMPLANT
GOWN STRL REUS W/ TWL LRG LVL3 (GOWN DISPOSABLE) ×1 IMPLANT
GOWN STRL REUS W/TWL LRG LVL3 (GOWN DISPOSABLE) ×3
KIT MARKER MARGIN INK (KITS) ×3 IMPLANT
NDL HYPO 25X1 1.5 SAFETY (NEEDLE) ×1 IMPLANT
NDL SAFETY ECLIPSE 18X1.5 (NEEDLE) IMPLANT
NEEDLE HYPO 18GX1.5 SHARP (NEEDLE)
NEEDLE HYPO 25X1 1.5 SAFETY (NEEDLE) ×3 IMPLANT
NS IRRIG 1000ML POUR BTL (IV SOLUTION) ×3 IMPLANT
PACK BASIN DAY SURGERY FS (CUSTOM PROCEDURE TRAY) ×3 IMPLANT
PENCIL BUTTON HOLSTER BLD 10FT (ELECTRODE) ×3 IMPLANT
SLEEVE SCD COMPRESS KNEE MED (MISCELLANEOUS) ×3 IMPLANT
SPONGE GAUZE 4X4 12PLY STER LF (GAUZE/BANDAGES/DRESSINGS) IMPLANT
SPONGE LAP 4X18 X RAY DECT (DISPOSABLE) ×3 IMPLANT
STRIP CLOSURE SKIN 1/2X4 (GAUZE/BANDAGES/DRESSINGS) ×2 IMPLANT
SUT MON AB 4-0 PC3 18 (SUTURE) ×3 IMPLANT
SUT SILK 2 0 SH (SUTURE) ×3 IMPLANT
SUT VICRYL 3-0 CR8 SH (SUTURE) ×3 IMPLANT
SYR CONTROL 10ML LL (SYRINGE) ×3 IMPLANT
TOWEL OR 17X24 6PK STRL BLUE (TOWEL DISPOSABLE) ×3 IMPLANT
TOWEL OR NON WOVEN STRL DISP B (DISPOSABLE) ×3 IMPLANT
TUBE CONNECTING 20'X1/4 (TUBING) ×1
TUBE CONNECTING 20X1/4 (TUBING) ×1 IMPLANT
YANKAUER SUCT BULB TIP NO VENT (SUCTIONS) ×2 IMPLANT

## 2016-02-24 NOTE — Progress Notes (Signed)
At patient was just ready to get in car for discharge she noticed right hand where IV had been was swollen and purple.  Applied 4X4 gauze and wrapped with coban tightly and applied ice.  Patient states it doesn't hurt.

## 2016-02-24 NOTE — Transfer of Care (Signed)
Immediate Anesthesia Transfer of Care Note  Patient: Stacey Doyle  Procedure(s) Performed: Procedure(s): LEFT BREAST RADIOACTIVE SEED GUIDED PARTIAL MASTECTOMY WITH AXILLARY SENTINEL LYMPH NODE BIOPSY (Left)  Patient Location: PACU  Anesthesia Type:GA combined with regional for post-op pain  Level of Consciousness: awake and patient cooperative  Airway & Oxygen Therapy: Patient Spontanous Breathing and Patient connected to face mask oxygen  Post-op Assessment: Report given to RN and Post -op Vital signs reviewed and stable  Post vital signs: Reviewed and stable  Last Vitals:  Filed Vitals:   02/24/16 1020 02/24/16 1030  BP:  135/77  Pulse: 92 91  Temp:    Resp: 15 15    Complications: No apparent anesthesia complications

## 2016-02-24 NOTE — H&P (Signed)
Stacey Doyle is an 70 y.o. female.   Chief Complaint:   Here for elective surgery for breast cancer. HPI:  She has newly diagnosed invasive ductal carcinoma of the left breast in the UOQ. ER positive, PR negative, Her2 negative, proliferation rate = 5%. She underwent a screening mammogram. Architectural distortion was noted in the upper outer quadrant. Ultrasound demonstrated a 1 cm mass at the 2:00 area. Image guided biopsy was performed demonstrating the above results. Of note is that she has saline implants. She has 2 aunts with breast cancer. No first degree relatives with breast cancer. Age at menarche was 75, first childbirth was early 84s, age at menopause was greater than 28.  She has had genetic testing which was negative.  She has also seen medical and radiation oncology preop.  RSL done earlier this week.  Past Medical History  Diagnosis Date  . Hypertension   . Wears glasses   . Anxiety     Past Surgical History  Procedure Laterality Date  . Tonsillectomy    . Breast enhancement surgery  [id number]    implants  . Tubal ligation    . Ulnar collateral ligament repair Left 08/18/2013    Procedure: LEFT THUMB ULNAR COLLATERAL LIGAMENT REPAIR;  Surgeon: Tennis Must, MD;  Location: [facility];  Service: Orthopedics;  Laterality: Left;    Family History  Problem Relation Age of Onset  . Skin cancer Mother     NOS; dx. late 48s; outside a lot  . Other Mother     history of hysterectomy in her mid-late 71s for unspecified reason  . Pancreatic cancer Father 89  . Esophageal cancer Brother 29    +tobacco, +EtOH  . Stroke Maternal Uncle   . Cancer Maternal Uncle     NOS; d. late 8s  . Breast cancer Maternal Aunt     d. mid-40s  . Breast cancer Maternal Aunt     dx. 54s  . Ovarian cancer Maternal Aunt     dx. after breast cancer (60s+)  . Ovarian cancer Maternal Aunt     d. late 49s  . Other Maternal Aunt     had a "hump on her back"  .  Pancreatic cancer Cousin     maternal 1st cousin dx. 63s  . Ovarian cancer Cousin     maternal 1st cousin dx. late 62s   Social History:  reports that she quit smoking about 18 years ago. Her smoking use included Cigarettes. She has a 15 pack-year smoking history. She has never used smokeless tobacco. She reports that she drinks about 3.0 - 4.2 oz of alcohol per week. She reports that she does not use illicit drugs.  Allergies:  Allergies  Allergen Reactions  . Percocet [Oxycodone-Acetaminophen]     Nightmares, makes nervous, skin crawles    Medications Prior to Admission  Medication Sig Dispense Refill  . atorvastatin (LIPITOR) 10 MG tablet Take 10 mg by mouth daily.    . Biotin 1 MG CAPS Take by mouth.    . calcium carbonate (OS-CAL - DOSED IN MG OF ELEMENTAL CALCIUM) 1250 MG tablet Take 1 tablet by mouth.    . cholecalciferol (VITAMIN D) 1000 UNITS tablet Take 1,000 Units by mouth daily.    . clonazePAM (KLONOPIN) 0.5 MG tablet Take 0.5 mg by mouth daily.    . Cyanocobalamin (VITAMIN B-12) 5000 MCG LOZG Take 5,000 mg by mouth.    Marland Kitchen glucosamine-chondroitin 500-400 MG tablet Take 1 tablet  by mouth 3 (three) times daily.    Marland Kitchen olmesartan (BENICAR) 20 MG tablet Take 20 mg by mouth daily.    . vitamin C (ASCORBIC ACID) 250 MG tablet Take 250 mg by mouth daily.    . vitamin E 400 UNIT capsule Take 400 Units by mouth daily.      No results found for this or any previous visit (from the past 48 hour(s)). Nm Sentinel Node Inj-no Rpt (breast)  02/24/2016  CLINICAL DATA: left breast cancer Sulfur colloid was injected intradermally by the nuclear medicine technologist for breast cancer sentinel node localization.    Review of Systems  Constitutional: Negative for fever and chills.  Gastrointestinal: Negative for nausea, vomiting and diarrhea.    Blood pressure 135/77, pulse 91, temperature 97.8 F (36.6 C), temperature source Oral, resp. rate 15, height 5' 3.5" (1.613 m), weight 69.854  kg (154 lb), SpO2 99 %. Physical Exam  Constitutional: She appears well-developed and well-nourished. No distress.  HENT:  Head: Normocephalic and atraumatic.  Eyes: No scleral icterus.  Respiratory:  Clear bandage on left breast.  Radioactivity detected in UOQ of left breast and left axilla via Neoprobe.  Neurological: She is alert.  Skin: Skin is warm and dry.  Psychiatric: She has a normal mood and affect. Her behavior is normal.     Assessment/Plan Newly diagnosed invasive left breast cancer.  Plan:  Left partial mastectomy after RSL and left axillary SLNBx.  Odis Hollingshead, MD 02/24/2016, 10:51 AM

## 2016-02-24 NOTE — Op Note (Signed)
Operative Note  Stacey Doyle female 70 y.o. 02/24/2016  PREOPERATIVE DX:  Invasive left breast cancer  POSTOPERATIVE DX:  Same  PROCEDURE:   1. Left axillary lymphatic mapping.  2.  Left partial mastectomy after radioactive seed localization.  3.  Left axillary sentinel lymph node biopsy.         Surgeon: Odis Hollingshead   Assistants: None  Anesthesia: General LMA anesthesia  Indications:   This is a 70 year old female with recently diagnosed breast cancer who presents for the above procedure.    Procedure Detail:  She had successful radioactive seed localization earlier this week. She had injection of technetium in the  Left breast in the holding room.  The left breast was marked with my initials.She was brought to the operating room and placed supine on the operating and a general anesthetic was given.  The left breast and axillary areas were sterilely prepped and draped.A timeout was performed.  A transverse incision was made in thncreased counts was noted This was sentinel lymph node #1. Counts were 1200. Placement of a neoprobe back into the left axilla did not demonstrate any other area of increased counts.  Hemostasis was adequate at the time. The subcutaneous tissue of the wound was closed with 3-0 Vicryl sutures.  The skin was closed with 4-0 Monocryl running subcuticular stitch.  Next, the left breast was approached and the area of increased counts was found in the UOQ.  A curvilinear incision was made over this area and subcutaneous flaps were raised.  Using the neoprobe as a guide, a partial mastectomy was performed with the deep margin being close to the capsule of her breast implant.  The specimen was oriented with ink and a specimen mammogram was performed which demonstrated the lesion, marking clip, and radioactive seed.  This was verified by the radiologist.  The wound was inspected and hemostasis was adequate.  The was no disruption of the implant noticed.  The  subcutaneous tissue was closed with 3-0 Vicryl sutures.  The skin was closed with a running 4-0 Monocryl subcuticular stitch.  Steri strips and sterile dressings were applied.  A breast binder was applied.  She tolerated the procedure well without any apparent complications and was taken to the PACU in satisfactory condition.   Estimated Blood Loss:  less than 100 mL         Specimens: Left breast tissue.  Left axillary sentinel lymph node        Complications:  * No complications entered in OR log *         Disposition: PACU - hemodynamically stable.         Condition: stable

## 2016-02-24 NOTE — Anesthesia Preprocedure Evaluation (Addendum)
Anesthesia Evaluation  Patient identified by MRN, date of birth, ID band Patient awake    Reviewed: Allergy & Precautions, NPO status , Patient's Chart, lab work & pertinent test results  Airway Mallampati: II  TM Distance: >3 FB Neck ROM: Full    Dental  (+) Teeth Intact, Dental Advisory Given   Pulmonary former smoker,    Pulmonary exam normal breath sounds clear to auscultation       Cardiovascular Exercise Tolerance: Good hypertension, Pt. on medications (-) CAD, (-) Past MI and (-) CHF Normal cardiovascular exam Rhythm:Regular Rate:Normal     Neuro/Psych PSYCHIATRIC DISORDERS Anxiety negative neurological ROS     GI/Hepatic negative GI ROS, Neg liver ROS,   Endo/Other  negative endocrine ROS  Renal/GU negative Renal ROS     Musculoskeletal negative musculoskeletal ROS (+)   Abdominal   Peds  Hematology negative hematology ROS (+)   Anesthesia Other Findings Day of surgery medications reviewed with the patient.  Left breast cancer  Reproductive/Obstetrics negative OB ROS                           Anesthesia Physical Anesthesia Plan  ASA: II  Anesthesia Plan: General   Post-op Pain Management:  Regional for Post-op pain and GA combined w/ Regional for post-op pain   Induction: Intravenous  Airway Management Planned: LMA  Additional Equipment:   Intra-op Plan:   Post-operative Plan: Extubation in OR  Informed Consent: I have reviewed the patients History and Physical, chart, labs and discussed the procedure including the risks, benefits and alternatives for the proposed anesthesia with the patient or authorized representative who has indicated his/her understanding and acceptance.   Dental advisory given  Plan Discussed with: CRNA  Anesthesia Plan Comments: (Risks/benefits of general anesthesia discussed with patient including risk of damage to teeth, lips, gum, and  tongue, nausea/vomiting, allergic reactions to medications, and the possibility of heart attack, stroke and death.  All patient questions answered.  Patient wishes to proceed.  GA plus PEC block)        Anesthesia Quick Evaluation

## 2016-02-24 NOTE — Discharge Instructions (Addendum)
[place] Office Phone Number [phone]  BREAST BIOPSY/ PARTIAL MASTECTOMY: POST OP INSTRUCTIONS  Always review your discharge instruction sheet given to you by the facility where your surgery was performed.  IF YOU HAVE DISABILITY OR FAMILY LEAVE FORMS, YOU MUST BRING THEM TO THE OFFICE FOR PROCESSING.  DO NOT GIVE THEM TO YOUR DOCTOR.  1. A prescription for pain medication may be given to you upon discharge.  Take your pain medication as prescribed, if needed.  If narcotic pain medicine is not needed, then you may take acetaminophen (Tylenol) or ibuprofen (Advil) as needed. 2. Take your usually prescribed medications unless otherwise directed 3. If you need a refill on your pain medication, please contact your pharmacy.  They will contact our office to request authorization.  Prescriptions will not be filled after 5pm or on week-ends. 4. You should eat very light the first 24 hours after surgery, such as soup, crackers, pudding, etc.  Resume your normal diet the day after surgery. 5. Most patients will experience some swelling and bruising in the breast.  Ice packs and a good support bra will help.  Swelling and bruising can take several days to resolve.  6. It is common to experience some constipation if taking pain medication after surgery.  Increasing fluid intake and taking a stool softener will usually help or prevent this problem from occurring.  A mild laxative (Milk of Magnesia or Miralax) should be taken according to package directions if there are no bowel movements after 48 hours. 7. Unless discharge instructions indicate otherwise, you may remove your bandages 48 hours after surgery, and you may shower at that time.  You may have steri-strips (small skin tapes) in place directly over the incision.  These strips should be left on the skin for 14 days.  If your surgeon used skin glue on the incision, you may shower in 24 hours.  The glue will flake off over the next  2-3 weeks.  Any sutures or staples will be removed at the office during your follow-up visit. 8. ACTIVITIES:  You may resume light daily activities (gradually increasing) beginning the next day.  Resume regular activities in one week as long as you do not have any pain at the surgery sites.  Wearing a good support bra or sports bra minimizes pain and swelling.  You may have sexual intercourse when it is comfortable. a. You may drive when you no longer are taking prescription pain medication, you can comfortably wear a seatbelt, and you can safely maneuver your car and apply brakes. b. RETURN TO WORK:  ______________________________________________________________________________________ 9. You should see your doctor in the office for a follow-up appointment approximately two weeks after your surgery.  Please call and make this appt.  Expect your pathology report 3 business days after your surgery.  You may call to check if you do not hear from [place] after three days. 10. OTHER INSTRUCTIONS: _______________________________________________________________________________________________ _____________________________________________________________________________________________________________________________________ _____________________________________________________________________________________________________________________________________ _____________________________________________________________________________________________________________________________________  WHEN TO CALL YOUR DOCTOR: 1. Fever over 101.0 2. Nausea and/or vomiting. 3. Extreme swelling or bruising. 4. Continued bleeding from incision. 5. Increased pain, redness, or drainage from the incision.  The clinic staff is available to answer your questions during regular business hours.  Please dont hesitate to call and ask to speak to one of the nurses for clinical concerns.  If you have a medical emergency, go to the  nearest emergency room or call 911.  A surgeon from [facility] is always on call at the  hospital.  For further questions, please visit [email or web address]      Post Anesthesia Home Care Instructions  Activity: Get plenty of rest for the remainder of the day. A responsible adult should stay with you for 24 hours following the procedure.  For the next 24 hours, DO NOT: -Drive a car -Paediatric nurse -Drink alcoholic beverages -Take any medication unless instructed by your physician -Make any legal decisions or sign important papers.  Meals: Start with liquid foods such as gelatin or soup. Progress to regular foods as tolerated. Avoid greasy, spicy, heavy foods. If nausea and/or vomiting occur, drink only clear liquids until the nausea and/or vomiting subsides. Call your physician if vomiting continues.  Special Instructions/Symptoms: Your throat may feel dry or sore from the anesthesia or the breathing tube placed in your throat during surgery. If this causes discomfort, gargle with warm salt water. The discomfort should disappear within 24 hours.  If you had a scopolamine patch placed behind your ear for the management of post- operative nausea and/or vomiting:  1. The medication in the patch is effective for 72 hours, after which it should be removed.  Wrap patch in a tissue and discard in the trash. Wash hands thoroughly with soap and water. 2. You may remove the patch earlier than 72 hours if you experience unpleasant side effects which may include dry mouth, dizziness or visual disturbances. 3. Avoid touching the patch. Wash your hands with soap and water after contact with the patch.

## 2016-02-24 NOTE — Progress Notes (Signed)
Assisted Dr. Gifford Shave with left, ultrasound guided, pectoralis block and nuc med inj. Side rails up, monitors on throughout procedure. See vital signs in flow sheet. Tolerated Procedure well.

## 2016-02-24 NOTE — Anesthesia Postprocedure Evaluation (Signed)
Anesthesia Post Note  Patient: Stacey Doyle  Procedure(s) Performed: Procedure(s) (LRB): LEFT BREAST RADIOACTIVE SEED GUIDED PARTIAL MASTECTOMY WITH AXILLARY SENTINEL LYMPH NODE BIOPSY (Left)  Patient location during evaluation: PACU Anesthesia Type: General and Regional Level of consciousness: awake and alert Pain management: pain level controlled Vital Signs Assessment: post-procedure vital signs reviewed and stable Respiratory status: spontaneous breathing, nonlabored ventilation, respiratory function stable and patient connected to nasal cannula oxygen Cardiovascular status: blood pressure returned to baseline and stable Postop Assessment: no signs of nausea or vomiting Anesthetic complications: no Comments: GA plus PEC block.    Last Vitals:  Filed Vitals:   02/24/16 1315 02/24/16 1330  BP: 123/69 133/80  Pulse: 73 75  Temp:  36.5 C  Resp: 19 16    Last Pain:  Filed Vitals:   02/24/16 1426  PainSc: 2                  Catalina Gravel

## 2016-02-24 NOTE — Anesthesia Procedure Notes (Addendum)
Anesthesia Regional Block:  Pectoralis block  Pre-Anesthetic Checklist: ,, timeout performed, Correct Patient, Correct Site, Correct Laterality, Correct Procedure, Correct Position, site marked, Risks and benefits discussed,  Surgical consent,  Pre-op evaluation,  At surgeon's request and post-op pain management  Laterality: Left  Prep: chloraprep       Needles:  Injection technique: Single-shot  Needle Type: Echogenic Needle     Needle Length: 9cm 9 cm Needle Gauge: 21 and 21 G    Additional Needles:  Procedures: ultrasound guided (picture in chart) Pectoralis block Narrative:  Injection made incrementally with aspirations every 5 mL.  Performed by: Personally  Anesthesiologist: Catalina Gravel  Additional Notes: No pain on injection. No increased resistance to injection. Injection made in 5cc increments.  Good needle visualization.  Patient tolerated procedure well.   Procedure Name: LMA Insertion Date/Time: 02/24/2016 11:10 AM Performed by: Toula Moos L Pre-anesthesia Checklist: Patient identified, Emergency Drugs available, Suction available, Patient being monitored and Timeout performed Patient Re-evaluated:Patient Re-evaluated prior to inductionOxygen Delivery Method: Circle System Utilized Preoxygenation: Pre-oxygenation with 100% oxygen Intubation Type: IV induction Ventilation: Mask ventilation without difficulty LMA: LMA inserted LMA Size: 4.0 Number of attempts: 1 Airway Equipment and Method: Bite block Placement Confirmation: positive ETCO2 Tube secured with: Tape Dental Injury: Teeth and Oropharynx as per pre-operative assessment

## 2016-02-24 NOTE — Interval H&P Note (Signed)
History and Physical Interval Note:  02/24/2016 11:03 AM  Stacey Doyle  has presented today for surgery, with the diagnosis of DCIS LEFT BREAST   The various methods of treatment have been discussed with the patient and family. After consideration of risks, benefits and other options for treatment, the patient has consented to  Procedure(s): LEFT BREAST RADIOACTIVE SEED GUIDED PARTIAL MASTECTOMY WITH AXILLARY SENTINEL LYMPH NODE BIOPSY (Left) as a surgical intervention .  The patient's history has been reviewed, patient examined, no change in status, stable for surgery.  I have reviewed the patient's chart and labs.  Questions were answered to the patient's satisfaction.     Kevork Joyce Lenna Sciara

## 2016-02-28 ENCOUNTER — Encounter (HOSPITAL_BASED_OUTPATIENT_CLINIC_OR_DEPARTMENT_OTHER): Payer: Self-pay | Admitting: General Surgery

## 2016-03-07 ENCOUNTER — Encounter: Payer: Self-pay | Admitting: Hematology and Oncology

## 2016-03-07 ENCOUNTER — Ambulatory Visit (HOSPITAL_BASED_OUTPATIENT_CLINIC_OR_DEPARTMENT_OTHER): Payer: PRIVATE HEALTH INSURANCE | Admitting: Hematology and Oncology

## 2016-03-07 ENCOUNTER — Telehealth: Payer: Self-pay | Admitting: Hematology and Oncology

## 2016-03-07 VITALS — BP 119/87 | HR 85 | Temp 97.8°F | Resp 17 | Wt 156.2 lb

## 2016-03-07 DIAGNOSIS — C50412 Malignant neoplasm of upper-outer quadrant of left female breast: Secondary | ICD-10-CM

## 2016-03-07 NOTE — Telephone Encounter (Signed)
appt made and avs printed °

## 2016-03-07 NOTE — Assessment & Plan Note (Signed)
Left lumpectomy 02/24/2016: Invasive ductal carcinoma grade 1, 1.3 cm, with low-grade DCIS, broadly less than 0.1 minutes to posterior margin, 0/1 lymph node, T1 cN0 stage IA, ER 95%, PR 0%, HER-2 negative ratio 1.15, Ki-67 5%  Pathology counseling: I discussed the final pathology report of the patient provided  a copy of this report. I discussed the margins as well as lymph node surgeries. We also discussed the final staging along with previously performed ER/PR and HER-2/neu testing.  Recommendation: 1. Presentation the tumor board regarding the posterior margin. 2. I discussed with the patient that my personal opinion is that she does not need additional surgery. 3. Oncotype DX has been sent and the results will be called to the patient as well as be receiving. 4. Adjuvant radiation therapy followed by 5. Adjuvant antiestrogen therapy.  Patient is an extremely active lady who exercises very regularly and [place] [facility] [place]). Return to clinic based upon Oncotype DX testing.

## 2016-03-07 NOTE — Progress Notes (Signed)
Patient Care Team: [name], MD as PCP - General (Internal Medicine)  SUMMARY OF ONCOLOGIC HISTORY:   Breast cancer of upper-outer quadrant of left female breast ([place])   12/30/2015 Initial Diagnosis Mammogram: Architectural distortion with spiculated and indistinct margin left breast central, 1 cm mass; biopsy: Invasive ductal carcinoma grade 1-2, with DCIS, LVID present, ER positive PR negative HER-2 negative Ki-67 5%, T1BN0 stage IA clinical stage   02/24/2016 Surgery Left lumpectomy: Invasive ductal carcinoma grade 1, 1.3 cm, with low-grade DCIS, broadly less than 0.1 minutes to posterior margin, 0/1 lymph node, T1 cN0 stage IA    CHIEF COMPLIANT: Follow-up after left lumpectomy  INTERVAL HISTORY: Stacey Doyle is a 70 year old with above-mentioned history left breast invasive ductal carcinoma who underwent lumpectomy and is here today to discuss the result. She reports to be feeling quite well. She did not take any pain medications. Last week she spent in the written years enjoying herself. She does not have any complaints or concerns today.  REVIEW OF SYSTEMS:   Constitutional: Denies fevers, chills or abnormal weight loss Eyes: Denies blurriness of vision Ears, nose, mouth, throat, and face: Denies mucositis or sore throat Respiratory: Denies cough, dyspnea or wheezes Cardiovascular: Denies palpitation, chest discomfort Gastrointestinal:  Denies nausea, heartburn or change in bowel habits Skin: Denies abnormal skin rashes Lymphatics: Denies new lymphadenopathy or easy bruising Neurological:Denies numbness, tingling or new weaknesses Behavioral/Psych: Mood is stable, no new changes  Extremities: No lower extremity edema Breast:  denies any pain or lumps or nodules in either breasts All other systems were reviewed with the patient and are negative.  I have reviewed the past medical history, past surgical history, social history and family history with the patient and  they are unchanged from previous note.  ALLERGIES:  is allergic to percocet.  MEDICATIONS:  Current Outpatient Prescriptions  Medication Sig Dispense Refill  . atorvastatin (LIPITOR) 10 MG tablet Take 10 mg by mouth daily.    . Biotin 1 MG CAPS Take by mouth.    . calcium carbonate (OS-CAL - DOSED IN MG OF ELEMENTAL CALCIUM) 1250 MG tablet Take 1 tablet by mouth.    . cholecalciferol (VITAMIN D) 1000 UNITS tablet Take 1,000 Units by mouth daily.    . clonazePAM (KLONOPIN) 0.5 MG tablet Take 0.5 mg by mouth daily.    . Cyanocobalamin (VITAMIN B-12) 5000 MCG LOZG Take 5,000 mg by mouth.    Marland Kitchen glucosamine-chondroitin 500-400 MG tablet Take 1 tablet by mouth 3 (three) times daily.    Marland Kitchen olmesartan (BENICAR) 20 MG tablet Take 20 mg by mouth daily.    . traMADol (ULTRAM) 50 MG tablet Take 1-2 tablets (50-100 mg total) by mouth every 6 (six) hours as needed for moderate pain or severe pain. 30 tablet 0  . vitamin C (ASCORBIC ACID) 250 MG tablet Take 250 mg by mouth daily.    . vitamin E 400 UNIT capsule Take 400 Units by mouth daily.     No current facility-administered medications for this visit.    PHYSICAL EXAMINATION: ECOG PERFORMANCE STATUS: 0 - Asymptomatic  Filed Vitals:   03/07/16 1535  BP: 119/87  Pulse: 85  Temp: 97.8 F (36.6 C)  Resp: 17   Filed Weights   03/07/16 1535  Weight: 156 lb 3.2 oz (70.852 kg)    GENERAL:alert, no distress and comfortable SKIN: skin color, texture, turgor are normal, no rashes or significant lesions EYES: normal, Conjunctiva are pink and non-injected, sclera clear OROPHARYNX:no  exudate, no erythema and lips, buccal mucosa, and tongue normal  NECK: supple, thyroid normal size, non-tender, without nodularity LYMPH:  no palpable lymphadenopathy in the cervical, axillary or inguinal LUNGS: clear to auscultation and percussion with normal breathing effort HEART: regular rate & rhythm and no murmurs and no lower extremity edema ABDOMEN:abdomen  soft, non-tender and normal bowel sounds MUSCULOSKELETAL:no cyanosis of digits and no clubbing  NEURO: alert & oriented x 3 with fluent speech, no focal motor/sensory deficits EXTREMITIES: No lower extremity edema  LABORATORY DATA:  I have reviewed the data as listed   Chemistry      Component Value Date/Time   NA 145 02/18/2016 1300   K 4.6 02/18/2016 1300   CL 106 02/18/2016 1300   CO2 28 02/18/2016 1300   BUN 17 02/18/2016 1300   CREATININE 0.78 02/18/2016 1300      Component Value Date/Time   CALCIUM 10.2 02/18/2016 1300   ALKPHOS 48 02/18/2016 1300   AST 37 02/18/2016 1300   ALT 36 02/18/2016 1300   BILITOT 0.8 02/18/2016 1300       Lab Results  Component Value Date   WBC 4.8 02/18/2016   HGB 12.3 02/18/2016   HCT 39.2 02/18/2016   MCV 95.4 02/18/2016   PLT 161 02/18/2016   NEUTROABS 1.8 02/18/2016   ASSESSMENT & PLAN:  Breast cancer of upper-outer quadrant of left female breast ([place]) Left lumpectomy 02/24/2016: Invasive ductal carcinoma grade 1, 1.3 cm, with low-grade DCIS, broadly less than 0.1 minutes to posterior margin, 0/1 lymph node, T1 cN0 stage IA, ER 95%, PR 0%, HER-2 negative ratio 1.15, Ki-67 5%  Pathology counseling: I discussed the final pathology report of the patient provided  a copy of this report. I discussed the margins as well as lymph node surgeries. We also discussed the final staging along with previously performed ER/PR and HER-2/neu testing.  Recommendation: 1. Presentation the tumor board regarding the posterior margin. 2. I discussed with the patient that my personal opinion is that she does not need additional surgery. 3. Oncotype DX has been sent and the results will be called to the patient as well as be receiving. 4. Adjuvant radiation therapy followed by 5. Adjuvant antiestrogen therapy.  Patient is an extremely active lady who exercises very regularly and enjoys her time at [place] [facility] [place]). Return to  clinic based upon Oncotype DX testing.   No orders of the defined types were placed in this encounter.   The patient has a good understanding of the overall plan. she agrees with it. she will call with any problems that may develop before the next visit here.   Rulon Eisenmenger, MD 03/07/2016

## 2016-03-08 ENCOUNTER — Telehealth: Payer: Self-pay | Admitting: *Deleted

## 2016-03-08 NOTE — Telephone Encounter (Signed)
Received oncotype order per Dr. Gudena. Requisition sent to pathology. Received by Keisha. 

## 2016-03-13 DIAGNOSIS — Z1379 Encounter for other screening for genetic and chromosomal anomalies: Secondary | ICD-10-CM | POA: Insufficient documentation

## 2016-03-13 NOTE — Progress Notes (Signed)
GENETIC TEST RESULT  HPI: Ms. Hasty was previously seen in the [place] clinic due to a personal history of breast cancer and colon polyps, family history of ovarian, breast, pancreatic, and other cancers, and concerns regarding a hereditary predisposition to cancer. Please refer to our prior cancer genetics clinic note from February 01, 2016 for more information regarding Ms. Oriordan's medical, social and family histories, and our assessment and recommendations, at the time. Ms. Golightly recent genetic test results were disclosed to her, as were recommendations warranted by these results. These results and recommendations are discussed in more detail below.  GENETIC TEST RESULTS: At the time of Ms. Burgett's visit on 02/01/16, we recommended she pursue genetic testing of the 32-gene Comprehensive Cancer Panel with MSH2 Exons 1-7 Inversion Analysis through [facility].  The Comprehensive Cancer Panel offered by [facility] includes sequencing and/or deletion duplication testing of the following 32 genes: APC, ATM, AXIN2, BARD1, BMPR1A, BRCA1, BRCA2, BRIP1, CDH1, CDK4, CDKN2A, CHEK2, EPCAM, FANCC, MLH1, MSH2, MSH6, MUTYH, NBN, PALB2, PMS2, POLD1, POLE, PTEN, RAD51C, RAD51D, SCG5/GREM1, SMAD4, STK11, TP53, VHL, and XRCC2.  At the time, this test was broken up into two steps to ensure that results would be back in time for surgery (8-gene Breast High/Moderate Risk Panel and 24-gene Custom Panel).  All results are now back, the report dates for which are February 09, 2016 and February 14, 2016.  Genetic testing was normal, and did not reveal a deleterious mutation in these genes.  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  The test report will be scanned into EPIC and will be located under the Results Review tab in the Pathology>Molecular Pathology section.   Genetic testing did identify two variants of uncertain significance  (VUSes).  One VUS called "c.1276C>T (p.Arg426Cys)" was found in one copy of the MUTYH gene.  The other VUS called "c.4523G>A (p.Arg1508His)" was found in one copy of the POLE gene.  At this time, it is unknown if these VUSes are associated with an increased risk for cancer or if these are a normal finding. Since these VUS results are uncertain, they cannot help guide screening recommendations, and family members should not be tested for these VUSes to help define their own cancer risks.  Also, we all have variants within our genes that make [place] unique individuals--most of these variants are benign.  Thus, we treat these VUSes as a negative result.   With time, we suspect the lab will reclassify these variants and when they do, we will try to re-contact Ms. Dokken to discuss the reclassification further.  We also encouraged Ms. Lichtenberger to contact us in a year or two to obtain an update on the status of these VUSes.  We discussed with Ms. Delsignore that since the current genetic testing is not perfect, it is possible there may be a gene mutation in one of these genes that current testing cannot detect, but that chance is small. We also discussed, that it is possible that another gene that has not yet been discovered, or that we have not yet tested, is responsible for the cancer diagnoses in the family, and it is, therefore, important to remain in touch with cancer genetics in the future so that we can continue to offer Ms. Ogas the most up to date genetic testing.     CANCER SCREENING RECOMMENDATIONS: This result is reassuring and indicates that Ms. Hackel likely does not have  an increased risk for a future cancer due to a mutation in one of these genes. This normal test also suggests that Ms. Vinluan's cancer was most likely not due to an inherited predisposition associated with one of these genes.  Most cancers happen by chance and this negative test suggests that her cancer falls into this category.  We,  therefore, recommended she continue to follow the cancer management and screening guidelines provided by her oncology and primary healthcare providers.   RECOMMENDATIONS FOR FAMILY MEMBERS: Women in this family might be at some increased risk of developing cancer, over the general population risk, simply due to the family history of cancer. We recommended women in this family have a yearly mammogram beginning at age 81, or 64 years younger than the earliest onset of cancer, an an annual clinical breast exam, and perform monthly breast self-exams. Women in this family should also have a gynecological exam as recommended by their primary provider. All family members should have a colonoscopy by age 62.  FOLLOW-UP: Lastly, we discussed with Ms. Palmatier that cancer genetics is a rapidly advancing field and it is possible that new genetic tests will be appropriate for her and/or her family members in the future. We encouraged her to remain in contact with cancer genetics on an annual basis so we can update her personal and family histories and let her know of advances in cancer genetics that may benefit this family.   Our contact number was provided. Ms. Marker questions were answered to her satisfaction, and she knows she is welcome to call us at anytime with additional questions or concerns.   Jeanine Luz, MS, [facility] Certified Genetic Counselor [email or web address] Phone: [phone]

## 2016-03-15 ENCOUNTER — Encounter (HOSPITAL_COMMUNITY): Payer: Self-pay

## 2016-03-15 ENCOUNTER — Telehealth: Payer: Self-pay | Admitting: *Deleted

## 2016-03-15 DIAGNOSIS — C50412 Malignant neoplasm of upper-outer quadrant of left female breast: Secondary | ICD-10-CM | POA: Diagnosis not present

## 2016-03-15 NOTE — Telephone Encounter (Signed)
Received Oncotype Dx results of 21/13%.  Placed a copy in Dr. Geralyn Flash box and took one to HIM to scan.

## 2016-03-16 ENCOUNTER — Telehealth: Payer: Self-pay | Admitting: *Deleted

## 2016-03-16 NOTE — Telephone Encounter (Signed)
Spoke with patient to inform her of her oncotype results.  Per Dr Lindi Adie she will not require chemo. l let her know that Dr. Clabe Seal office will call her with an appointment.  Encouraged her to call with any needs or concerns.

## 2016-03-21 NOTE — Progress Notes (Addendum)
Location of Breast Cancer: invasive ductal carcinoma of the left breast in the [facility]  Histology per Pathology Report:   02/24/16 Diagnosis 1. Lymph node, sentinel, biopsy, Left axillary #1 - THERE IS NO EVIDENCE OF CARCINOMA IN 1 OF 1 LYMPH NODE (0/1). 2. Breast, lumpectomy, Left - INVASIVE DUCTAL CARCINOMA, GRADE I/III, SPANNING 1.3 CM. - DUCTAL CARCINOMA IN SITU WITH CALCIFICATIONS, LOW GRADE. - DUCTAL CARCINOMA IN SITU IS BROADLY LESS THAN 0.1 CM TO THE POSTERIOR MARGIN. - SEE ONCOLOGY TABLE BELOW.  01/03/16   Receptor Status: ER(95% positive), PR (0%), Her2-neu (negative)  Did patient present with symptoms (if so, please note symptoms) or was this found on screening mammography?: mammogram  Past/Anticipated interventions by surgeon, if any: 02/24/16 - Procedure: LEFT BREAST RADIOACTIVE SEED GUIDED PARTIAL MASTECTOMY WITH AXILLARY SENTINEL LYMPH NODE BIOPSY;  Surgeon: Jackolyn Confer, MD;  Location: [place];  Service: General;  Laterality: Left;  Past/Anticipated interventions by medical oncology, if any: Adjuvant antiestrogen therapy  Lymphedema issues, if any: no  Pain issues, if any: no   OB Gyn: Patient was 15 at first menses. She was 81 with the birth of her first child. She has 3 children. She has not used bcp or hormone replacement.  SAFETY ISSUES:  Prior radiation? no  Pacemaker/ICD? no  Possible current pregnancy?no  Is the patient on methotrexate? no  Current Complaints / other details:Patient would like to have her CT [facility] today or tomorrow.  She is going on vacation Thursday and returning Sunday 04/02/16.  BP 127/68 mmHg  Pulse 86  Temp(Src) 98.3 F (36.8 C) (Oral)  Resp 16  Ht 5' 3.5" (1.613 m)  Wt 154 lb 1.6 oz (69.899 kg)  BMI 26.87 kg/m2

## 2016-03-28 ENCOUNTER — Ambulatory Visit
Admission: RE | Admit: 2016-03-28 | Discharge: 2016-03-28 | Disposition: A | Discharge status: Home / Self Care | Payer: Medicare Other | Source: Ambulatory Visit | Attending: Radiation Oncology | Admitting: Radiation Oncology

## 2016-03-28 ENCOUNTER — Encounter: Payer: Self-pay | Admitting: Radiation Oncology

## 2016-03-28 VITALS — BP 127/68 | HR 86 | Temp 98.3°F | Resp 16 | Ht 63.5 in | Wt 154.1 lb

## 2016-03-28 DIAGNOSIS — C50412 Malignant neoplasm of upper-outer quadrant of left female breast: Secondary | ICD-10-CM

## 2016-03-28 DIAGNOSIS — Z79899 Other long term (current) drug therapy: Secondary | ICD-10-CM | POA: Insufficient documentation

## 2016-03-28 NOTE — Progress Notes (Signed)
Radiation Oncology         [phone] ________________________________  Name: Stacey Doyle MRN: [id number]  Date: 03/28/2016  DOB: February 22, 1946  Follow-Up Visit Note  CC: Stacey Pao, MD  Stacey Doyle, Stacey Him, MD    ICD-9-CM ICD-10-CM   1. Breast cancer of upper-outer quadrant of left female breast ([place]) 174.4 C50.412     Diagnosis:  Stage IA invasive ductal carcinoma of the left breast    Breast cancer of upper-outer quadrant of left female breast ([place])   12/30/2015 Initial Diagnosis Mammogram: Architectural distortion with spiculated and indistinct margin left breast central, 1 cm mass; biopsy: Invasive ductal carcinoma grade 1-2, with DCIS, LVID present, ER positive PR negative HER-2 negative Ki-67 5%, T1BN0 stage IA clinical stage   02/24/2016 Surgery Left lumpectomy: Invasive ductal carcinoma grade 1, 1.3 cm, with low-grade DCIS, broadly less than 0.1 minutes to posterior margin, 0/1 lymph node, T1 cN0 stage IA      Narrative:  The patient returns today for further evaluation. She was initially seen  in consultation on 02/03/2016. Since that time the patient is undergone her definitive surgery under the direction of Dr. Zella Richer . The patient underwent lumpectomy and sentinel node procedure. Pathology revealed an invasive ductal carcinoma, grade 1 extending over 1.3 cm. There was associated ductal carcinoma carcinoma in situ with calcifications, low-grade. The DCIS was broadly less than 1 mm to the posterior margin. This was towards the patient's implant and therefore additional surgery was not indicated. Patient has done well since her surgery. She did undergo Oncotype DX testing which showed low risk for recurrence and therefore adjuvant chemotherapy was not recommended. She will proceed with adjuvant hormonal therapy after completion of radiation treatments.                    ALLERGIES:  is allergic to percocet.  Meds: Current Outpatient Prescriptions  Medication Sig  Dispense Refill  . Biotin 1 MG CAPS Take by mouth.    . calcium carbonate (OS-CAL - DOSED IN MG OF ELEMENTAL CALCIUM) 1250 MG tablet Take 1 tablet by mouth.    . cetirizine (ZYRTEC) 5 MG tablet Take 5 mg by mouth.    . cholecalciferol (VITAMIN D) 1000 UNITS tablet Take 1,000 Units by mouth daily.    . clonazePAM (KLONOPIN) 0.5 MG tablet Take 0.5 mg by mouth daily.    . Cyanocobalamin (VITAMIN B-12) 5000 MCG LOZG Take 5,000 mg by mouth.    Marland Kitchen glucosamine-chondroitin 500-400 MG tablet Take 1 tablet by mouth 3 (three) times daily.    Marland Kitchen olmesartan (BENICAR) 20 MG tablet Take 20 mg by mouth daily.    . vitamin C (ASCORBIC ACID) 250 MG tablet Take 250 mg by mouth daily.    . vitamin E 400 UNIT capsule Take 400 Units by mouth daily.    Marland Kitchen atorvastatin (LIPITOR) 10 MG tablet Take 10 mg by mouth daily. Reported on 03/28/2016    . traMADol (ULTRAM) 50 MG tablet Take 1-2 tablets (50-100 mg total) by mouth every 6 (six) hours as needed for moderate pain or severe pain. (Patient not taking: Reported on 03/28/2016) 30 tablet 0   No current facility-administered medications for this encounter.    Physical Findings: The patient is in no acute distress. Patient is alert and oriented.  height is 5' 3.5" (1.613 m) and weight is 154 lb 1.6 oz (69.899 kg). Her oral temperature is 98.3 F (36.8 C). Her blood pressure is 127/68 and her  pulse is 86. Her respiration is 16. Marland Kitchen No palpable subclavicular or axillary adenopathy. Lungs are clear to auscultation. The heart has regular rhythm and rate. The left breast area shows a well healing scar in the upper outer quadrant and a second scar in the axillary region. No signs of drainage or infection. Implant noted in the left breast.  Lab Findings: Lab Results  Component Value Date   WBC 4.8 02/18/2016   HGB 12.3 02/18/2016   HCT 39.2 02/18/2016   MCV 95.4 02/18/2016   PLT 161 02/18/2016    Radiographic Findings: No results found.  Impression:  The patient will be a  good candidate for breast conserving therapy with radiation therapy directed at the left breast area. I discussed the treatment course side effects and potential toxicities of radiation therapy in this situation with the patient. Given the presence of implants within the chest area I would not recommend hypo-fractionated radiation therapy in this situation. Patient does understand she is at risk for capsular fibrosis and subsequent chronic pain but is willing to accept this potential risk. She has already met with plastic surgery in case her implants need to be removed at a later date.  Plan:  Simulation and planning tomorrow with treatments to begin approximately 5-6 weeks postop  ____________________________________ Gery Pray, MD

## 2016-03-28 NOTE — Progress Notes (Signed)
Please see the Nurse Progress Note in the MD Initial Consult Encounter for this patient. 

## 2016-03-29 ENCOUNTER — Ambulatory Visit
Admission: RE | Admit: 2016-03-29 | Discharge: 2016-03-29 | Disposition: A | Discharge status: Home / Self Care | Payer: Medicare Other | Source: Ambulatory Visit | Attending: Radiation Oncology | Admitting: Radiation Oncology

## 2016-03-29 DIAGNOSIS — I10 Essential (primary) hypertension: Secondary | ICD-10-CM | POA: Diagnosis not present

## 2016-03-29 DIAGNOSIS — Z803 Family history of malignant neoplasm of breast: Secondary | ICD-10-CM | POA: Diagnosis not present

## 2016-03-29 DIAGNOSIS — Z510 Encounter for antineoplastic radiation therapy: Secondary | ICD-10-CM | POA: Diagnosis not present

## 2016-03-29 DIAGNOSIS — C50412 Malignant neoplasm of upper-outer quadrant of left female breast: Secondary | ICD-10-CM | POA: Diagnosis not present

## 2016-03-29 DIAGNOSIS — Z170 Estrogen receptor positive status [ER+]: Secondary | ICD-10-CM | POA: Diagnosis not present

## 2016-03-29 DIAGNOSIS — Z87891 Personal history of nicotine dependence: Secondary | ICD-10-CM | POA: Diagnosis not present

## 2016-03-29 NOTE — Progress Notes (Signed)
  Radiation Oncology         [phone] ________________________________  Name: Stacey Doyle MRN: [id number]  Date: 03/29/2016  DOB: 08/09/1946  SIMULATION AND TREATMENT PLANNING NOTE    ICD-9-CM ICD-10-CM   1. Breast cancer of upper-outer quadrant of left female breast ([place]) 174.4 C50.412     DIAGNOSIS:  Stage IA invasive ductal carcinoma of the left breast  NARRATIVE:  The patient was brought to the [facility].  Identity was confirmed.  All relevant records and images related to the planned course of therapy were reviewed.  The patient freely provided informed written consent to proceed with treatment after reviewing the details related to the planned course of therapy. The consent form was witnessed and verified by the simulation staff.  Then, the patient was set-up in a stable reproducible  supine position for radiation therapy.  CT images were obtained.  Surface markings were placed.  The CT images were loaded into the planning software.  Then the target and avoidance structures were contoured.  Treatment planning then occurred.  The radiation prescription was entered and confirmed.  Then, I designed and supervised the construction of a total of 3 medically necessary complex treatment devices.  I have requested : 3D Simulation  I have requested a DVH of the following structures: heart, lungs, CTV.  I have ordered:dose calc.  PLAN:  The patient will receive 50.4 Gy in 28 fractions Followed by a boost to the lumpectomy cavity of 12 gray.   -----------------------------------  Blair Promise, PhD, MD  This document serves as a record of services personally performed by Gery Pray, MD. It was created on his behalf by Arlyce Harman, a trained medical scribe. The creation of this record is based on the scribe's personal observations and the provider's statements to them. This document has been checked and approved by the attending provider.

## 2016-04-04 DIAGNOSIS — Z170 Estrogen receptor positive status [ER+]: Secondary | ICD-10-CM | POA: Diagnosis not present

## 2016-04-04 DIAGNOSIS — I10 Essential (primary) hypertension: Secondary | ICD-10-CM | POA: Diagnosis not present

## 2016-04-04 DIAGNOSIS — Z510 Encounter for antineoplastic radiation therapy: Secondary | ICD-10-CM | POA: Diagnosis not present

## 2016-04-04 DIAGNOSIS — C50412 Malignant neoplasm of upper-outer quadrant of left female breast: Secondary | ICD-10-CM | POA: Diagnosis not present

## 2016-04-04 DIAGNOSIS — Z87891 Personal history of nicotine dependence: Secondary | ICD-10-CM | POA: Diagnosis not present

## 2016-04-04 DIAGNOSIS — Z803 Family history of malignant neoplasm of breast: Secondary | ICD-10-CM | POA: Diagnosis not present

## 2016-04-04 NOTE — Progress Notes (Signed)
  Radiation Oncology         [phone] ________________________________  Name: PHENYX MATSUSHITA MRN: [id number]  Date: 03/29/2016  DOB: 1946/08/16  Optical Surface Tracking Plan:  Since intensity modulated radiotherapy (IMRT) and 3D conformal radiation treatment methods are predicated on accurate and precise positioning for treatment, intrafraction motion monitoring is medically necessary to ensure accurate and safe treatment delivery.  The ability to quantify intrafraction motion without excessive ionizing radiation dose can only be performed with optical surface tracking. Accordingly, surface imaging offers the opportunity to obtain 3D measurements of patient position throughout IMRT and 3D treatments without excessive radiation exposure.  I am ordering optical surface tracking for this patient's upcoming course of radiotherapy. ________________________________  Gery Pray, MD 04/04/2016 6:26 PM    Reference:   Particia Jasper, et al. Surface imaging-based analysis of intrafraction motion for breast radiotherapy patients.Journal of [place], n. 6, nov. 2014. ISSN [id number].   Available at: <http://www.jacmp.org/index.php/jacmp/article/view/4957>.

## 2016-04-05 ENCOUNTER — Ambulatory Visit: Payer: Medicare Other

## 2016-04-05 ENCOUNTER — Ambulatory Visit
Admission: RE | Admit: 2016-04-05 | Discharge: 2016-04-05 | Disposition: A | Discharge status: Home / Self Care | Payer: Medicare Other | Source: Ambulatory Visit | Attending: Radiation Oncology | Admitting: Radiation Oncology

## 2016-04-05 ENCOUNTER — Ambulatory Visit: Payer: Medicare Other | Admitting: Radiation Oncology

## 2016-04-05 DIAGNOSIS — Z510 Encounter for antineoplastic radiation therapy: Secondary | ICD-10-CM | POA: Diagnosis not present

## 2016-04-05 DIAGNOSIS — I10 Essential (primary) hypertension: Secondary | ICD-10-CM | POA: Diagnosis not present

## 2016-04-05 DIAGNOSIS — Z87891 Personal history of nicotine dependence: Secondary | ICD-10-CM | POA: Diagnosis not present

## 2016-04-05 DIAGNOSIS — C50412 Malignant neoplasm of upper-outer quadrant of left female breast: Secondary | ICD-10-CM

## 2016-04-05 DIAGNOSIS — Z803 Family history of malignant neoplasm of breast: Secondary | ICD-10-CM | POA: Diagnosis not present

## 2016-04-05 DIAGNOSIS — Z170 Estrogen receptor positive status [ER+]: Secondary | ICD-10-CM | POA: Diagnosis not present

## 2016-04-05 NOTE — Progress Notes (Signed)
  Radiation Oncology         [phone] ________________________________  Name: Stacey Doyle MRN: [id number]  Date: 04/05/2016  DOB: [birth year]  Simulation Verification Note    ICD-9-CM ICD-10-CM   1. Breast cancer of upper-outer quadrant of left female breast ([place]) 174.4 C50.412     Status: outpatient  NARRATIVE: The patient was brought to the treatment unit and placed in the planned treatment position. The clinical setup was verified. Then port films were obtained and uploaded to the radiation oncology medical record software.  The treatment beams were carefully compared against the planned radiation fields. The position location and shape of the radiation fields was reviewed. They targeted volume of tissue appears to be appropriately covered by the radiation beams. Organs at risk appear to be excluded as planned.  Based on my personal review, I approved the simulation verification. The patient's treatment will proceed as planned.  -----------------------------------  Blair Promise, PhD, MD

## 2016-04-06 ENCOUNTER — Ambulatory Visit
Admission: RE | Admit: 2016-04-06 | Discharge: 2016-04-06 | Disposition: A | Discharge status: Home / Self Care | Payer: Medicare Other | Source: Ambulatory Visit | Attending: Radiation Oncology | Admitting: Radiation Oncology

## 2016-04-06 ENCOUNTER — Ambulatory Visit: Payer: Medicare Other | Admitting: Radiation Oncology

## 2016-04-06 DIAGNOSIS — Z170 Estrogen receptor positive status [ER+]: Secondary | ICD-10-CM | POA: Diagnosis not present

## 2016-04-06 DIAGNOSIS — Z803 Family history of malignant neoplasm of breast: Secondary | ICD-10-CM | POA: Diagnosis not present

## 2016-04-06 DIAGNOSIS — I10 Essential (primary) hypertension: Secondary | ICD-10-CM | POA: Diagnosis not present

## 2016-04-06 DIAGNOSIS — Z510 Encounter for antineoplastic radiation therapy: Secondary | ICD-10-CM | POA: Diagnosis not present

## 2016-04-06 DIAGNOSIS — C50412 Malignant neoplasm of upper-outer quadrant of left female breast: Secondary | ICD-10-CM | POA: Diagnosis not present

## 2016-04-06 DIAGNOSIS — Z87891 Personal history of nicotine dependence: Secondary | ICD-10-CM | POA: Diagnosis not present

## 2016-04-07 ENCOUNTER — Ambulatory Visit
Admission: RE | Admit: 2016-04-07 | Discharge: 2016-04-07 | Disposition: A | Discharge status: Home / Self Care | Payer: Medicare Other | Source: Ambulatory Visit | Attending: Radiation Oncology | Admitting: Radiation Oncology

## 2016-04-07 DIAGNOSIS — Z803 Family history of malignant neoplasm of breast: Secondary | ICD-10-CM | POA: Diagnosis not present

## 2016-04-07 DIAGNOSIS — Z170 Estrogen receptor positive status [ER+]: Secondary | ICD-10-CM | POA: Diagnosis not present

## 2016-04-07 DIAGNOSIS — Z87891 Personal history of nicotine dependence: Secondary | ICD-10-CM | POA: Diagnosis not present

## 2016-04-07 DIAGNOSIS — I10 Essential (primary) hypertension: Secondary | ICD-10-CM | POA: Diagnosis not present

## 2016-04-07 DIAGNOSIS — C50412 Malignant neoplasm of upper-outer quadrant of left female breast: Secondary | ICD-10-CM | POA: Diagnosis not present

## 2016-04-07 DIAGNOSIS — Z510 Encounter for antineoplastic radiation therapy: Secondary | ICD-10-CM | POA: Diagnosis not present

## 2016-04-11 ENCOUNTER — Ambulatory Visit
Admission: RE | Admit: 2016-04-11 | Discharge: 2016-04-11 | Disposition: A | Discharge status: Home / Self Care | Payer: Medicare Other | Source: Ambulatory Visit | Attending: Radiation Oncology | Admitting: Radiation Oncology

## 2016-04-11 DIAGNOSIS — C50412 Malignant neoplasm of upper-outer quadrant of left female breast: Secondary | ICD-10-CM | POA: Diagnosis not present

## 2016-04-11 DIAGNOSIS — I10 Essential (primary) hypertension: Secondary | ICD-10-CM | POA: Diagnosis not present

## 2016-04-11 DIAGNOSIS — Z510 Encounter for antineoplastic radiation therapy: Secondary | ICD-10-CM | POA: Diagnosis not present

## 2016-04-11 DIAGNOSIS — Z87891 Personal history of nicotine dependence: Secondary | ICD-10-CM | POA: Diagnosis not present

## 2016-04-11 DIAGNOSIS — Z170 Estrogen receptor positive status [ER+]: Secondary | ICD-10-CM | POA: Diagnosis not present

## 2016-04-11 DIAGNOSIS — Z803 Family history of malignant neoplasm of breast: Secondary | ICD-10-CM | POA: Diagnosis not present

## 2016-04-12 ENCOUNTER — Ambulatory Visit
Admission: RE | Admit: 2016-04-12 | Discharge: 2016-04-12 | Disposition: A | Discharge status: Home / Self Care | Payer: Medicare Other | Source: Ambulatory Visit | Attending: Radiation Oncology | Admitting: Radiation Oncology

## 2016-04-12 ENCOUNTER — Encounter: Payer: Self-pay | Admitting: Radiation Oncology

## 2016-04-12 VITALS — BP 116/77 | HR 93 | Temp 98.4°F | Ht 63.5 in | Wt 151.1 lb

## 2016-04-12 DIAGNOSIS — C50412 Malignant neoplasm of upper-outer quadrant of left female breast: Secondary | ICD-10-CM | POA: Diagnosis not present

## 2016-04-12 DIAGNOSIS — Z510 Encounter for antineoplastic radiation therapy: Secondary | ICD-10-CM | POA: Diagnosis not present

## 2016-04-12 DIAGNOSIS — Z803 Family history of malignant neoplasm of breast: Secondary | ICD-10-CM | POA: Diagnosis not present

## 2016-04-12 DIAGNOSIS — I10 Essential (primary) hypertension: Secondary | ICD-10-CM | POA: Diagnosis not present

## 2016-04-12 DIAGNOSIS — Z87891 Personal history of nicotine dependence: Secondary | ICD-10-CM | POA: Diagnosis not present

## 2016-04-12 DIAGNOSIS — Z170 Estrogen receptor positive status [ER+]: Secondary | ICD-10-CM | POA: Diagnosis not present

## 2016-04-12 MED ORDER — RADIAPLEXRX EX GEL
Freq: Once | CUTANEOUS | Status: AC
  Administered 2016-04-12: 14:00:00 via TOPICAL

## 2016-04-12 MED ORDER — ALRA NON-METALLIC DEODORANT (RAD-ONC)
1.0000 "application " | Freq: Once | TOPICAL | Status: AC
  Administered 2016-04-12: 1 via TOPICAL

## 2016-04-12 NOTE — Progress Notes (Signed)
Pt here for patient teaching.  Pt given Radiation and You booklet, skin care instructions, Alra deodorant and Radiaplex gel. Pt reports they have not watched the Radiation Therapy Education video and has been given the link to watch at home.  Reviewed areas of pertinence such as fatigue, skin changes and breast swelling . Pt able to give teach back of to pat skin and use unscented/gentle soap,apply Radiaplex bid and avoid applying anything to skin within 4 hours of treatment. Pt demonstrated understanding and verbalizes understanding of information given and will contact nursing with any questions or concerns.     Http://rtanswers.org/treatmentinformation/whattoexpect/index

## 2016-04-12 NOTE — Progress Notes (Signed)
Stacey Doyle has completed 4 fractions to her left breast.  She does not have any complaints today.  The skin on her left breast is intact.    BP 116/77 mmHg  Pulse 93  Temp(Src) 98.4 F (36.9 C) (Oral)  Ht 5' 3.5" (1.613 m)  Wt 151 lb 1.6 oz (68.539 kg)  BMI 26.34 kg/m2

## 2016-04-12 NOTE — Progress Notes (Signed)
  Radiation Oncology         [phone] ________________________________  Name: Stacey Doyle MRN: [id number]  Date: 04/12/2016  DOB: 07-06-1946  Weekly Radiation Therapy Management    ICD-9-CM ICD-10-CM   1. Breast cancer of upper-outer quadrant of left female breast ([facility]) 174.4 C50.412     Current Dose: 7.2 Gy     Planned Dose:  62.4 Gy  Narrative . . . . . . . . The patient presents for routine under treatment assessment.                              Stacey Doyle has completed 4 fractions to her left breast. She does not have any complaints today.                                Set-up films were reviewed.                                 The chart was checked. Physical Findings. . .  height is 5' 3.5" (1.613 m) and weight is 151 lb 1.6 oz (68.539 kg). Her oral temperature is 98.4 F (36.9 C). Her blood pressure is 116/77 and her pulse is 93. . Weight essentially stable.  No significant changes. Lungs are clear to auscultation bilaterally. Heart has regular rate and rhythm. No appreciable radiation reaction at this time. Impression . . . . . . . The patient is tolerating radiation. Plan . . . . . . . . . . . . Continue treatment as planned.  ________________________________   Blair Promise, PhD, MD  This document serves as a record of services personally performed by Gery Pray, MD. It was created on his behalf by Darcus Austin, a trained medical scribe. The creation of this record is based on the scribe's personal observations and the provider's statements to them. This document has been checked and approved by the attending provider.

## 2016-04-13 ENCOUNTER — Ambulatory Visit
Admission: RE | Admit: 2016-04-13 | Discharge: 2016-04-13 | Disposition: A | Discharge status: Home / Self Care | Payer: Medicare Other | Source: Ambulatory Visit | Attending: Radiation Oncology | Admitting: Radiation Oncology

## 2016-04-13 ENCOUNTER — Telehealth: Payer: Self-pay | Admitting: *Deleted

## 2016-04-13 DIAGNOSIS — Z170 Estrogen receptor positive status [ER+]: Secondary | ICD-10-CM | POA: Diagnosis not present

## 2016-04-13 DIAGNOSIS — Z510 Encounter for antineoplastic radiation therapy: Secondary | ICD-10-CM | POA: Diagnosis not present

## 2016-04-13 DIAGNOSIS — I10 Essential (primary) hypertension: Secondary | ICD-10-CM | POA: Diagnosis not present

## 2016-04-13 DIAGNOSIS — C50412 Malignant neoplasm of upper-outer quadrant of left female breast: Secondary | ICD-10-CM | POA: Diagnosis not present

## 2016-04-13 DIAGNOSIS — Z87891 Personal history of nicotine dependence: Secondary | ICD-10-CM | POA: Diagnosis not present

## 2016-04-13 DIAGNOSIS — Z803 Family history of malignant neoplasm of breast: Secondary | ICD-10-CM | POA: Diagnosis not present

## 2016-04-13 NOTE — Telephone Encounter (Signed)
Spoke with patient to follow up after start of radiation.  She states she is doing great. Encouraged her to call with any needs or concerns.

## 2016-04-14 ENCOUNTER — Ambulatory Visit
Admission: RE | Admit: 2016-04-14 | Discharge: 2016-04-14 | Disposition: A | Discharge status: Home / Self Care | Payer: Medicare Other | Source: Ambulatory Visit | Attending: Radiation Oncology | Admitting: Radiation Oncology

## 2016-04-14 DIAGNOSIS — C50412 Malignant neoplasm of upper-outer quadrant of left female breast: Secondary | ICD-10-CM | POA: Diagnosis not present

## 2016-04-14 DIAGNOSIS — Z510 Encounter for antineoplastic radiation therapy: Secondary | ICD-10-CM | POA: Diagnosis not present

## 2016-04-14 DIAGNOSIS — Z87891 Personal history of nicotine dependence: Secondary | ICD-10-CM | POA: Diagnosis not present

## 2016-04-14 DIAGNOSIS — Z170 Estrogen receptor positive status [ER+]: Secondary | ICD-10-CM | POA: Diagnosis not present

## 2016-04-14 DIAGNOSIS — Z803 Family history of malignant neoplasm of breast: Secondary | ICD-10-CM | POA: Diagnosis not present

## 2016-04-14 DIAGNOSIS — I10 Essential (primary) hypertension: Secondary | ICD-10-CM | POA: Diagnosis not present

## 2016-04-17 ENCOUNTER — Ambulatory Visit
Admission: RE | Admit: 2016-04-17 | Discharge: 2016-04-17 | Disposition: A | Discharge status: Home / Self Care | Payer: Medicare Other | Source: Ambulatory Visit | Attending: Radiation Oncology | Admitting: Radiation Oncology

## 2016-04-17 DIAGNOSIS — Z803 Family history of malignant neoplasm of breast: Secondary | ICD-10-CM | POA: Diagnosis not present

## 2016-04-17 DIAGNOSIS — Z87891 Personal history of nicotine dependence: Secondary | ICD-10-CM | POA: Diagnosis not present

## 2016-04-17 DIAGNOSIS — Z510 Encounter for antineoplastic radiation therapy: Secondary | ICD-10-CM | POA: Diagnosis not present

## 2016-04-17 DIAGNOSIS — I10 Essential (primary) hypertension: Secondary | ICD-10-CM | POA: Diagnosis not present

## 2016-04-17 DIAGNOSIS — C50412 Malignant neoplasm of upper-outer quadrant of left female breast: Secondary | ICD-10-CM | POA: Diagnosis not present

## 2016-04-17 DIAGNOSIS — Z170 Estrogen receptor positive status [ER+]: Secondary | ICD-10-CM | POA: Diagnosis not present

## 2016-04-18 ENCOUNTER — Ambulatory Visit
Admission: RE | Admit: 2016-04-18 | Discharge: 2016-04-18 | Disposition: A | Discharge status: Home / Self Care | Payer: Medicare Other | Source: Ambulatory Visit | Attending: Radiation Oncology | Admitting: Radiation Oncology

## 2016-04-18 ENCOUNTER — Encounter: Payer: Self-pay | Admitting: Radiation Oncology

## 2016-04-18 VITALS — BP 137/79 | HR 90 | Temp 97.9°F | Resp 16 | Ht 63.5 in | Wt 150.2 lb

## 2016-04-18 DIAGNOSIS — Z510 Encounter for antineoplastic radiation therapy: Secondary | ICD-10-CM | POA: Diagnosis not present

## 2016-04-18 DIAGNOSIS — I10 Essential (primary) hypertension: Secondary | ICD-10-CM | POA: Diagnosis not present

## 2016-04-18 DIAGNOSIS — C50412 Malignant neoplasm of upper-outer quadrant of left female breast: Secondary | ICD-10-CM

## 2016-04-18 DIAGNOSIS — Z87891 Personal history of nicotine dependence: Secondary | ICD-10-CM | POA: Diagnosis not present

## 2016-04-18 DIAGNOSIS — Z803 Family history of malignant neoplasm of breast: Secondary | ICD-10-CM | POA: Diagnosis not present

## 2016-04-18 DIAGNOSIS — Z170 Estrogen receptor positive status [ER+]: Secondary | ICD-10-CM | POA: Diagnosis not present

## 2016-04-18 NOTE — Progress Notes (Signed)
Patient denies having pain or fatigue.  Her skin was assessed by Dr. Sondra Come as patient needed to leave for an appointment.

## 2016-04-18 NOTE — Progress Notes (Signed)
  Radiation Oncology         [phone] ________________________________  Name: Stacey Doyle MRN: [id number]  Date: 04/18/2016  DOB: 1946/03/19  Weekly Radiation Therapy Management  Breast cancer of upper-outer quadrant of left female breast ([place]).  Current Dose: 14.4 Gy     Planned Dose:  62.4 Gy  Narrative . . . . . . . . The patient presents for routine under treatment assessment.                               She is without complaint. She is feeling well.                                Set-up films were reviewed.                                 The chart was checked. Physical Findings. . .  height is 5' 3.5" (1.613 m) and weight is 150 lb 3.2 oz (68.13 kg). Her oral temperature is 97.9 F (36.6 C). Her blood pressure is 137/79 and her pulse is 90. Her respiration is 16. . Weight essentially stable.  No significant changes. No appreciable radiation reaction at this time. Impression . . . . . . . The patient is tolerating radiation. Plan . . . . . . . . . . . . Continue treatment as planned.  ________________________________   Blair Promise, PhD, MD    This document serves as a record of services personally performed by Gery Pray, MD. It was created on his behalf by Lendon Collar, a trained medical scribe. The creation of this record is based on the scribe's personal observations and the provider's statements to them. This document has been checked and approved by the attending provider.

## 2016-04-19 ENCOUNTER — Ambulatory Visit
Admission: RE | Admit: 2016-04-19 | Discharge: 2016-04-19 | Disposition: A | Discharge status: Home / Self Care | Payer: Medicare Other | Source: Ambulatory Visit | Attending: Radiation Oncology | Admitting: Radiation Oncology

## 2016-04-19 DIAGNOSIS — Z87891 Personal history of nicotine dependence: Secondary | ICD-10-CM | POA: Diagnosis not present

## 2016-04-19 DIAGNOSIS — Z170 Estrogen receptor positive status [ER+]: Secondary | ICD-10-CM | POA: Diagnosis not present

## 2016-04-19 DIAGNOSIS — C50412 Malignant neoplasm of upper-outer quadrant of left female breast: Secondary | ICD-10-CM | POA: Diagnosis not present

## 2016-04-19 DIAGNOSIS — H52203 Unspecified astigmatism, bilateral: Secondary | ICD-10-CM | POA: Diagnosis not present

## 2016-04-19 DIAGNOSIS — D3132 Benign neoplasm of left choroid: Secondary | ICD-10-CM | POA: Diagnosis not present

## 2016-04-19 DIAGNOSIS — Z510 Encounter for antineoplastic radiation therapy: Secondary | ICD-10-CM | POA: Diagnosis not present

## 2016-04-19 DIAGNOSIS — Z803 Family history of malignant neoplasm of breast: Secondary | ICD-10-CM | POA: Diagnosis not present

## 2016-04-19 DIAGNOSIS — I10 Essential (primary) hypertension: Secondary | ICD-10-CM | POA: Diagnosis not present

## 2016-04-19 DIAGNOSIS — H2513 Age-related nuclear cataract, bilateral: Secondary | ICD-10-CM | POA: Diagnosis not present

## 2016-04-20 ENCOUNTER — Ambulatory Visit
Admission: RE | Admit: 2016-04-20 | Discharge: 2016-04-20 | Disposition: A | Discharge status: Home / Self Care | Payer: Medicare Other | Source: Ambulatory Visit | Attending: Radiation Oncology | Admitting: Radiation Oncology

## 2016-04-20 DIAGNOSIS — I10 Essential (primary) hypertension: Secondary | ICD-10-CM | POA: Diagnosis not present

## 2016-04-20 DIAGNOSIS — Z510 Encounter for antineoplastic radiation therapy: Secondary | ICD-10-CM | POA: Diagnosis not present

## 2016-04-20 DIAGNOSIS — C50412 Malignant neoplasm of upper-outer quadrant of left female breast: Secondary | ICD-10-CM | POA: Diagnosis not present

## 2016-04-20 DIAGNOSIS — Z87891 Personal history of nicotine dependence: Secondary | ICD-10-CM | POA: Diagnosis not present

## 2016-04-20 DIAGNOSIS — Z803 Family history of malignant neoplasm of breast: Secondary | ICD-10-CM | POA: Diagnosis not present

## 2016-04-20 DIAGNOSIS — Z170 Estrogen receptor positive status [ER+]: Secondary | ICD-10-CM | POA: Diagnosis not present

## 2016-04-21 ENCOUNTER — Ambulatory Visit
Admission: RE | Admit: 2016-04-21 | Discharge: 2016-04-21 | Disposition: A | Discharge status: Home / Self Care | Payer: Medicare Other | Source: Ambulatory Visit | Attending: Radiation Oncology | Admitting: Radiation Oncology

## 2016-04-21 DIAGNOSIS — Z87891 Personal history of nicotine dependence: Secondary | ICD-10-CM | POA: Diagnosis not present

## 2016-04-21 DIAGNOSIS — C50412 Malignant neoplasm of upper-outer quadrant of left female breast: Secondary | ICD-10-CM | POA: Diagnosis not present

## 2016-04-21 DIAGNOSIS — I10 Essential (primary) hypertension: Secondary | ICD-10-CM | POA: Diagnosis not present

## 2016-04-21 DIAGNOSIS — Z170 Estrogen receptor positive status [ER+]: Secondary | ICD-10-CM | POA: Diagnosis not present

## 2016-04-21 DIAGNOSIS — Z510 Encounter for antineoplastic radiation therapy: Secondary | ICD-10-CM | POA: Diagnosis not present

## 2016-04-21 DIAGNOSIS — Z803 Family history of malignant neoplasm of breast: Secondary | ICD-10-CM | POA: Diagnosis not present

## 2016-04-24 ENCOUNTER — Ambulatory Visit
Admission: RE | Admit: 2016-04-24 | Discharge: 2016-04-24 | Disposition: A | Discharge status: Home / Self Care | Payer: Medicare Other | Source: Ambulatory Visit | Attending: Radiation Oncology | Admitting: Radiation Oncology

## 2016-04-24 DIAGNOSIS — C50412 Malignant neoplasm of upper-outer quadrant of left female breast: Secondary | ICD-10-CM | POA: Diagnosis not present

## 2016-04-24 DIAGNOSIS — Z170 Estrogen receptor positive status [ER+]: Secondary | ICD-10-CM | POA: Diagnosis not present

## 2016-04-24 DIAGNOSIS — Z87891 Personal history of nicotine dependence: Secondary | ICD-10-CM | POA: Diagnosis not present

## 2016-04-24 DIAGNOSIS — Z803 Family history of malignant neoplasm of breast: Secondary | ICD-10-CM | POA: Diagnosis not present

## 2016-04-24 DIAGNOSIS — I10 Essential (primary) hypertension: Secondary | ICD-10-CM | POA: Diagnosis not present

## 2016-04-24 DIAGNOSIS — Z510 Encounter for antineoplastic radiation therapy: Secondary | ICD-10-CM | POA: Diagnosis not present

## 2016-04-25 ENCOUNTER — Ambulatory Visit
Admission: RE | Admit: 2016-04-25 | Discharge: 2016-04-25 | Disposition: A | Discharge status: Home / Self Care | Payer: Medicare Other | Source: Ambulatory Visit | Attending: Radiation Oncology | Admitting: Radiation Oncology

## 2016-04-25 ENCOUNTER — Encounter: Payer: Self-pay | Admitting: Radiation Oncology

## 2016-04-25 VITALS — BP 137/73 | HR 93 | Temp 97.9°F | Resp 16 | Ht 63.5 in | Wt 150.5 lb

## 2016-04-25 DIAGNOSIS — Z803 Family history of malignant neoplasm of breast: Secondary | ICD-10-CM | POA: Diagnosis not present

## 2016-04-25 DIAGNOSIS — Z510 Encounter for antineoplastic radiation therapy: Secondary | ICD-10-CM | POA: Diagnosis not present

## 2016-04-25 DIAGNOSIS — I10 Essential (primary) hypertension: Secondary | ICD-10-CM | POA: Diagnosis not present

## 2016-04-25 DIAGNOSIS — C50412 Malignant neoplasm of upper-outer quadrant of left female breast: Secondary | ICD-10-CM | POA: Diagnosis not present

## 2016-04-25 DIAGNOSIS — Z87891 Personal history of nicotine dependence: Secondary | ICD-10-CM | POA: Diagnosis not present

## 2016-04-25 DIAGNOSIS — Z170 Estrogen receptor positive status [ER+]: Secondary | ICD-10-CM | POA: Diagnosis not present

## 2016-04-25 NOTE — Progress Notes (Addendum)
Stacey Doyle has completed 13 fractions to her left breast.  She denies pain and fatigue.  She is using radiaplex gel.  The skin on her left breast is pink.  The area of fluid buildup on the outside of her left breast is slightly darker than the rest of her skin.  Her bp was low today at 90/66 and 137/73 when retaken.    BP 90/66 mmHg  Pulse 92  Temp(Src) 97.9 F (36.6 C) (Oral)  Resp 16  Ht 5' 3.5" (1.613 m)  Wt 150 lb 8 oz (68.266 kg)  BMI 26.24 kg/m2

## 2016-04-25 NOTE — Progress Notes (Signed)
  Radiation Oncology         [phone] ________________________________  Name: Stacey PLEGER MRN: [id number]  Date: 04/25/2016  DOB: [birth year]  Weekly Radiation Therapy Management    ICD-9-CM ICD-10-CM   1. Breast cancer of upper-outer quadrant of left female breast ([facility]) 174.4 C50.412      Current Dose: 23.4 Gy     Planned Dose:  62.4 Gy  Narrative . . . . . . . . The patient presents for routine under treatment assessment.                                   The patient is without complaint. No fatigue or skin issues                                 Set-up films were reviewed.                                 The chart was checked. Physical Findings. . .  height is 5' 3.5" (1.613 m) and weight is 150 lb 8 oz (68.266 kg). Her oral temperature is 97.9 F (36.6 C). Her blood pressure is 137/73 and her pulse is 93. Her respiration is 16. . Lungs are clear to auscultation bilaterally. Heart has regular rate and rhythm. No palpable cervical, supraclavicular, or axillary adenopathy. Abdomen soft, non-tender, normal bowel sounds. Left breast area shows some mild hyperpigmentation changes and erythema.  Impression . . . . . . . The patient is tolerating radiation. Plan . . . . . . . . . . . . Continue treatment as planned.  ________________________________   Blair Promise, PhD, MD

## 2016-04-26 ENCOUNTER — Ambulatory Visit
Admission: RE | Admit: 2016-04-26 | Discharge: 2016-04-26 | Disposition: A | Discharge status: Home / Self Care | Payer: Medicare Other | Source: Ambulatory Visit | Attending: Radiation Oncology | Admitting: Radiation Oncology

## 2016-04-26 DIAGNOSIS — Z510 Encounter for antineoplastic radiation therapy: Secondary | ICD-10-CM | POA: Diagnosis not present

## 2016-04-26 DIAGNOSIS — Z170 Estrogen receptor positive status [ER+]: Secondary | ICD-10-CM | POA: Diagnosis not present

## 2016-04-26 DIAGNOSIS — I10 Essential (primary) hypertension: Secondary | ICD-10-CM | POA: Diagnosis not present

## 2016-04-26 DIAGNOSIS — Z87891 Personal history of nicotine dependence: Secondary | ICD-10-CM | POA: Diagnosis not present

## 2016-04-26 DIAGNOSIS — Z803 Family history of malignant neoplasm of breast: Secondary | ICD-10-CM | POA: Diagnosis not present

## 2016-04-26 DIAGNOSIS — C50412 Malignant neoplasm of upper-outer quadrant of left female breast: Secondary | ICD-10-CM | POA: Diagnosis not present

## 2016-04-27 ENCOUNTER — Ambulatory Visit
Admission: RE | Admit: 2016-04-27 | Discharge: 2016-04-27 | Disposition: A | Discharge status: Home / Self Care | Payer: Medicare Other | Source: Ambulatory Visit | Attending: Radiation Oncology | Admitting: Radiation Oncology

## 2016-04-27 DIAGNOSIS — Z87891 Personal history of nicotine dependence: Secondary | ICD-10-CM | POA: Diagnosis not present

## 2016-04-27 DIAGNOSIS — I10 Essential (primary) hypertension: Secondary | ICD-10-CM | POA: Diagnosis not present

## 2016-04-27 DIAGNOSIS — Z803 Family history of malignant neoplasm of breast: Secondary | ICD-10-CM | POA: Diagnosis not present

## 2016-04-27 DIAGNOSIS — C50412 Malignant neoplasm of upper-outer quadrant of left female breast: Secondary | ICD-10-CM | POA: Diagnosis not present

## 2016-04-27 DIAGNOSIS — Z510 Encounter for antineoplastic radiation therapy: Secondary | ICD-10-CM | POA: Diagnosis not present

## 2016-04-27 DIAGNOSIS — Z170 Estrogen receptor positive status [ER+]: Secondary | ICD-10-CM | POA: Diagnosis not present

## 2016-04-28 ENCOUNTER — Ambulatory Visit
Admission: RE | Admit: 2016-04-28 | Discharge: 2016-04-28 | Disposition: A | Discharge status: Home / Self Care | Payer: Medicare Other | Source: Ambulatory Visit | Attending: Radiation Oncology | Admitting: Radiation Oncology

## 2016-04-28 DIAGNOSIS — C50412 Malignant neoplasm of upper-outer quadrant of left female breast: Secondary | ICD-10-CM | POA: Diagnosis not present

## 2016-04-28 DIAGNOSIS — Z170 Estrogen receptor positive status [ER+]: Secondary | ICD-10-CM | POA: Diagnosis not present

## 2016-04-28 DIAGNOSIS — Z87891 Personal history of nicotine dependence: Secondary | ICD-10-CM | POA: Diagnosis not present

## 2016-04-28 DIAGNOSIS — Z803 Family history of malignant neoplasm of breast: Secondary | ICD-10-CM | POA: Diagnosis not present

## 2016-04-28 DIAGNOSIS — I10 Essential (primary) hypertension: Secondary | ICD-10-CM | POA: Diagnosis not present

## 2016-04-28 DIAGNOSIS — Z510 Encounter for antineoplastic radiation therapy: Secondary | ICD-10-CM | POA: Diagnosis not present

## 2016-05-01 ENCOUNTER — Ambulatory Visit
Admission: RE | Admit: 2016-05-01 | Discharge: 2016-05-01 | Disposition: A | Discharge status: Home / Self Care | Payer: Medicare Other | Source: Ambulatory Visit | Attending: Radiation Oncology | Admitting: Radiation Oncology

## 2016-05-01 DIAGNOSIS — Z510 Encounter for antineoplastic radiation therapy: Secondary | ICD-10-CM | POA: Diagnosis not present

## 2016-05-01 DIAGNOSIS — C50412 Malignant neoplasm of upper-outer quadrant of left female breast: Secondary | ICD-10-CM | POA: Diagnosis not present

## 2016-05-01 DIAGNOSIS — Z803 Family history of malignant neoplasm of breast: Secondary | ICD-10-CM | POA: Diagnosis not present

## 2016-05-01 DIAGNOSIS — Z87891 Personal history of nicotine dependence: Secondary | ICD-10-CM | POA: Diagnosis not present

## 2016-05-01 DIAGNOSIS — I10 Essential (primary) hypertension: Secondary | ICD-10-CM | POA: Diagnosis not present

## 2016-05-01 DIAGNOSIS — Z170 Estrogen receptor positive status [ER+]: Secondary | ICD-10-CM | POA: Diagnosis not present

## 2016-05-02 ENCOUNTER — Encounter: Payer: Self-pay | Admitting: Radiation Oncology

## 2016-05-02 ENCOUNTER — Ambulatory Visit
Admission: RE | Admit: 2016-05-02 | Discharge: 2016-05-02 | Disposition: A | Discharge status: Home / Self Care | Payer: Medicare Other | Source: Ambulatory Visit | Attending: Radiation Oncology | Admitting: Radiation Oncology

## 2016-05-02 VITALS — BP 114/91 | HR 97 | Temp 97.9°F | Ht 63.5 in | Wt 150.2 lb

## 2016-05-02 DIAGNOSIS — C50412 Malignant neoplasm of upper-outer quadrant of left female breast: Secondary | ICD-10-CM | POA: Diagnosis not present

## 2016-05-02 DIAGNOSIS — I10 Essential (primary) hypertension: Secondary | ICD-10-CM | POA: Diagnosis not present

## 2016-05-02 DIAGNOSIS — Z803 Family history of malignant neoplasm of breast: Secondary | ICD-10-CM | POA: Diagnosis not present

## 2016-05-02 DIAGNOSIS — Z510 Encounter for antineoplastic radiation therapy: Secondary | ICD-10-CM | POA: Diagnosis not present

## 2016-05-02 DIAGNOSIS — Z170 Estrogen receptor positive status [ER+]: Secondary | ICD-10-CM | POA: Diagnosis not present

## 2016-05-02 DIAGNOSIS — Z87891 Personal history of nicotine dependence: Secondary | ICD-10-CM | POA: Diagnosis not present

## 2016-05-02 NOTE — Progress Notes (Signed)
  Radiation Oncology         [phone] ________________________________  Name: Stacey Doyle MRN: [id number]  Date: 05/02/2016  DOB: 02/13/1946  Weekly Radiation Therapy Management    ICD-9-CM ICD-10-CM   1. Breast cancer of upper-outer quadrant of left female breast ([facility]) 174.4 C50.412      Current Dose: 32.4 Gy     Planned Dose:  62.4 Gy  Narrative . . . . . . . . The patient presents for routine under treatment assessment.                                    Stacey Doyle has completed 18 fractions to her left breast. She denies having pain or fatigue. She is using radiaplex. The skin on her left breast is red in the upper portion and underneath her breast with dermatitis. She denies having any itching.                                  Set-up films were reviewed.                                 The chart was checked. Physical Findings. . .  height is 5' 3.5" (1.613 m) and weight is 150 lb 3.2 oz (68.13 kg). Her oral temperature is 97.9 F (36.6 C). Her blood pressure is 114/91 and her pulse is 97. Her oxygen saturation is 98%. . Lungs are clear to auscultation bilaterally. Heart has regular rate and rhythm. No palpable cervical, supraclavicular, or axillary adenopathy. Abdomen soft, non-tender, normal bowel sounds. Left breast area shows radiation dermatitis in the upper inner aspect and some in the inframammary fold.  Impression . . . . . . . The patient is tolerating radiation. Plan . . . . . . . . . . . . Continue treatment as planned.  ________________________________   Blair Promise, PhD, MD  This document serves as a record of services personally performed by Gery Pray, MD. It was created on his behalf by Derek Mound, a trained medical scribe. The creation of this record is based on the scribe's personal observations and the provider's statements to them. This document has been checked and approved by the attending provider.

## 2016-05-02 NOTE — Progress Notes (Signed)
Stacey Doyle has completed 18 fractions to her left breast.  She denies having pain or fatigue.  She is using radiaplex.  The skin on her left breast is red in the upper portion and underneath her breast with dermatitis. She denies having any itching.  BP 114/91 mmHg  Pulse 97  Temp(Src) 97.9 F (36.6 C) (Oral)  Ht 5' 3.5" (1.613 m)  Wt 150 lb 3.2 oz (68.13 kg)  BMI 26.19 kg/m2  SpO2 98%

## 2016-05-03 ENCOUNTER — Ambulatory Visit
Admission: RE | Admit: 2016-05-03 | Discharge: 2016-05-03 | Disposition: A | Discharge status: Home / Self Care | Payer: Medicare Other | Source: Ambulatory Visit | Attending: Radiation Oncology | Admitting: Radiation Oncology

## 2016-05-03 DIAGNOSIS — C50412 Malignant neoplasm of upper-outer quadrant of left female breast: Secondary | ICD-10-CM | POA: Insufficient documentation

## 2016-05-03 DIAGNOSIS — Z510 Encounter for antineoplastic radiation therapy: Secondary | ICD-10-CM | POA: Insufficient documentation

## 2016-05-04 ENCOUNTER — Encounter: Payer: Self-pay | Admitting: Radiation Oncology

## 2016-05-04 ENCOUNTER — Ambulatory Visit
Admission: RE | Admit: 2016-05-04 | Discharge: 2016-05-04 | Disposition: A | Discharge status: Home / Self Care | Payer: Medicare Other | Source: Ambulatory Visit | Attending: Radiation Oncology | Admitting: Radiation Oncology

## 2016-05-04 DIAGNOSIS — Z510 Encounter for antineoplastic radiation therapy: Secondary | ICD-10-CM | POA: Diagnosis not present

## 2016-05-04 DIAGNOSIS — C50412 Malignant neoplasm of upper-outer quadrant of left female breast: Secondary | ICD-10-CM | POA: Diagnosis not present

## 2016-05-04 NOTE — Progress Notes (Signed)
  Radiation Oncology         [phone] ________________________________  Name: Stacey Doyle MRN: [id number]  Date: 05/04/2016  DOB: Jul 21, 1946  Electron beam Simulation  Note    ICD-9-CM ICD-10-CM   1. Breast cancer of upper-outer quadrant of left female breast ([place]) 174.4 C50.412     Status: outpatient  NARRATIVE:The patient underwent additional planning for radiation therapy directed at the left breast today. Patient's treatment planning CT scan was reviewed and she had set up of a custom electron cutout field directed at the lumpectomy cavity within the left breast. The patient will be treated with 9 Me V electrons prescribed to the 100% isodose line.  Plan the patient will receive 6 additional treatments at 2 gray per fraction for a boost dose of 12 gray. A special port plan is requested for treatment -----------------------------------  Blair Promise, PhD, MD

## 2016-05-05 ENCOUNTER — Ambulatory Visit
Admission: RE | Admit: 2016-05-05 | Discharge: 2016-05-05 | Disposition: A | Discharge status: Home / Self Care | Payer: Medicare Other | Source: Ambulatory Visit | Attending: Radiation Oncology | Admitting: Radiation Oncology

## 2016-05-05 DIAGNOSIS — C50412 Malignant neoplasm of upper-outer quadrant of left female breast: Secondary | ICD-10-CM | POA: Diagnosis not present

## 2016-05-05 DIAGNOSIS — Z510 Encounter for antineoplastic radiation therapy: Secondary | ICD-10-CM | POA: Diagnosis not present

## 2016-05-08 ENCOUNTER — Ambulatory Visit
Admission: RE | Admit: 2016-05-08 | Discharge: 2016-05-08 | Disposition: A | Discharge status: Home / Self Care | Payer: Medicare Other | Source: Ambulatory Visit | Attending: Radiation Oncology | Admitting: Radiation Oncology

## 2016-05-08 DIAGNOSIS — Z510 Encounter for antineoplastic radiation therapy: Secondary | ICD-10-CM | POA: Diagnosis not present

## 2016-05-08 DIAGNOSIS — C50412 Malignant neoplasm of upper-outer quadrant of left female breast: Secondary | ICD-10-CM | POA: Diagnosis not present

## 2016-05-09 ENCOUNTER — Ambulatory Visit
Admission: RE | Admit: 2016-05-09 | Discharge: 2016-05-09 | Disposition: A | Discharge status: Home / Self Care | Payer: Medicare Other | Source: Ambulatory Visit | Attending: Radiation Oncology | Admitting: Radiation Oncology

## 2016-05-09 VITALS — BP 130/67 | HR 70 | Temp 97.6°F | Resp 16 | Ht <= 58 in | Wt 151.4 lb

## 2016-05-09 DIAGNOSIS — Z510 Encounter for antineoplastic radiation therapy: Secondary | ICD-10-CM | POA: Diagnosis not present

## 2016-05-09 DIAGNOSIS — C50412 Malignant neoplasm of upper-outer quadrant of left female breast: Secondary | ICD-10-CM

## 2016-05-09 NOTE — Progress Notes (Signed)
  Radiation Oncology         [phone] ________________________________  Name: Stacey Doyle MRN: [id number]  Date: 05/09/2016  DOB: 01/01/1946  Weekly Radiation Therapy Management    ICD-9-CM ICD-10-CM   1. Breast cancer of upper-outer quadrant of left female breast ([facility]) 174.4 C50.412      Current Dose: 41.4 Gy     Planned Dose:  62.4 Gy  Narrative . . . . . . . . The patient presents for routine under treatment assessment.  Stacey Doyle has completed 23 fractions to her left breast. She denies having pain or fatigue. She is using radiaplex. Her only complaint at this time is pruritus in the treatment area.                                  Set-up films were reviewed.                                 The chart was checked. Physical Findings. . .  height is 5.35" (0.136 m) and weight is 151 lb 6.4 oz (68.675 kg). Her oral temperature is 97.6 F (36.4 C). Her blood pressure is 130/67 and her pulse is 70. Her respiration is 16 and oxygen saturation is 98%. . Lungs are clear to auscultation bilaterally. Heart has regular rate and rhythm. No palpable cervical or supraclavicular adenopathy. Left breast area shows radiation dermatitis in the upper inner aspect of the left breast. Impression . . . . . . . The patient is tolerating radiation. Plan . . . . . . . . . . . . Continue treatment as planned. The patient was given Sonafine cream today.  ________________________________   Blair Promise, PhD, MD  This document serves as a record of services personally performed by Gery Pray, MD. It was created on his behalf by Darcus Austin, a trained medical scribe. The creation of this record is based on the scribe's personal observations and the provider's statements to them. This document has been checked and approved by the attending provider.

## 2016-05-09 NOTE — Progress Notes (Signed)
Stacey Doyle has completed 23 fractions to her left breast. She denies having pain or fatigue. She is using radiaplex. The skin on her left breast is red in the upper portion and underneath her breast with dermatitis. She denies having any itching.  Changed to Sonafine cream today. BP 130/67 mmHg  Pulse 70  Temp(Src) 97.6 F (36.4 C) (Oral)  Resp 16  Ht 5.35" (0.136 m)  Wt 151 lb 6.4 oz (68.675 kg)  BMI 3712.96 kg/m2  SpO2 98%

## 2016-05-10 ENCOUNTER — Ambulatory Visit
Admission: RE | Admit: 2016-05-10 | Discharge: 2016-05-10 | Disposition: A | Discharge status: Home / Self Care | Payer: Medicare Other | Source: Ambulatory Visit | Attending: Radiation Oncology | Admitting: Radiation Oncology

## 2016-05-10 DIAGNOSIS — C50412 Malignant neoplasm of upper-outer quadrant of left female breast: Secondary | ICD-10-CM | POA: Diagnosis not present

## 2016-05-10 DIAGNOSIS — Z510 Encounter for antineoplastic radiation therapy: Secondary | ICD-10-CM | POA: Diagnosis not present

## 2016-05-10 NOTE — Progress Notes (Signed)
  Radiation Oncology         [phone] ________________________________  Name: Stacey Doyle MRN: [id number]  Date: 05/10/2016  DOB: 06/29/1946  Electron beam Verification Note    ICD-9-CM ICD-10-CM   1. Breast cancer of upper-outer quadrant of left female breast ([place]) 174.4 C50.412     Status: outpatient  NARRATIVE: The patient was brought to the treatment unit and placed in the planned treatment position. The clinical setup was verified. Then port films were obtained and uploaded to the radiation oncology medical record software.  The treatment beams were carefully compared against the planned radiation fields. The position location and shape of the radiation fields was reviewed. They targeted volume of tissue appears to be appropriately covered by the radiation beams. Organs at risk appear to be excluded as planned.  Based on my personal review, I approved the simulation verification. The patient's treatment will proceed as planned.  -----------------------------------  Blair Promise, PhD, MD

## 2016-05-11 ENCOUNTER — Ambulatory Visit
Admission: RE | Admit: 2016-05-11 | Discharge: 2016-05-11 | Disposition: A | Discharge status: Home / Self Care | Payer: Medicare Other | Source: Ambulatory Visit | Attending: Radiation Oncology | Admitting: Radiation Oncology

## 2016-05-11 DIAGNOSIS — C50412 Malignant neoplasm of upper-outer quadrant of left female breast: Secondary | ICD-10-CM | POA: Diagnosis not present

## 2016-05-11 DIAGNOSIS — Z510 Encounter for antineoplastic radiation therapy: Secondary | ICD-10-CM | POA: Diagnosis not present

## 2016-05-12 ENCOUNTER — Encounter: Payer: Self-pay | Admitting: Radiation Oncology

## 2016-05-12 ENCOUNTER — Ambulatory Visit
Admission: RE | Admit: 2016-05-12 | Discharge: 2016-05-12 | Disposition: A | Discharge status: Home / Self Care | Payer: Medicare Other | Source: Ambulatory Visit | Attending: Radiation Oncology | Admitting: Radiation Oncology

## 2016-05-12 VITALS — BP 144/82 | HR 72 | Temp 97.8°F | Resp 16 | Ht >= 80 in | Wt 150.8 lb

## 2016-05-12 DIAGNOSIS — C50412 Malignant neoplasm of upper-outer quadrant of left female breast: Secondary | ICD-10-CM | POA: Diagnosis not present

## 2016-05-12 DIAGNOSIS — Z510 Encounter for antineoplastic radiation therapy: Secondary | ICD-10-CM | POA: Diagnosis not present

## 2016-05-12 NOTE — Progress Notes (Signed)
  Radiation Oncology         (336) [phone] ________________________________  Name: Stacey Doyle MRN: [id number]  Date: 05/12/2016  DOB: 12-09-1945  Weekly Radiation Therapy Management    ICD-9-CM ICD-10-CM   1. Breast cancer of upper-outer quadrant of left female breast (HCC) 174.4 C50.412      Current Dose: 46.8 Gy     Planned Dose:  62.4 Gy  Narrative . . . . . . . . The patient presents for routine under treatment assessment.  Stacey Doyle has completed 26 fractions to her left breast. She denies having pain or fatigue. She is using Sonafine and Hydrocortisone cream at night.  She denies having any itching. Changed to Sonafine cream . Good energy, staying physically active.                                   Set-up films were reviewed.                                 The chart was checked. Physical Findings. . .  height is 7\' 11"  (2.413 m) and weight is 150 lb 12.8 oz (68.402 kg). Her oral temperature is 97.8 F (36.6 C). Her blood pressure is 144/82 and her pulse is 72. Her respiration is 16 and oxygen saturation is 98%. .   Left breast erythema with early radiation dermatitis in the upper inner quadrant.  Impression . . . . . . . The patient is tolerating radiation. Plan . . . . . . . . . . . . Continue treatment as planned.   -----------------------------------  Eppie Gibson, MD   This document serves as a record of services personally performed by Eppie Gibson, MD. It was created on her behalf by Derek Mound, a trained medical scribe. The creation of this record is based on the scribe's personal observations and the provider's statements to them. This document has been checked and approved by the attending provider.

## 2016-05-12 NOTE — Progress Notes (Signed)
Analyse Stacey Doyle has completed 26 fractions to her left breast. She denies having pain or fatigue. She is using Sonafine and Hydrocortisone cream at night. The skin on her left breast is red in the upper portion and underneath her breast with dermatitis. She denies having any itching. Changed to Sonafine cream today.

## 2016-05-15 ENCOUNTER — Ambulatory Visit
Admission: RE | Admit: 2016-05-15 | Discharge: 2016-05-15 | Disposition: A | Discharge status: Home / Self Care | Payer: Medicare Other | Source: Ambulatory Visit | Attending: Radiation Oncology | Admitting: Radiation Oncology

## 2016-05-15 DIAGNOSIS — Z510 Encounter for antineoplastic radiation therapy: Secondary | ICD-10-CM | POA: Diagnosis not present

## 2016-05-15 DIAGNOSIS — C50412 Malignant neoplasm of upper-outer quadrant of left female breast: Secondary | ICD-10-CM | POA: Diagnosis not present

## 2016-05-17 ENCOUNTER — Ambulatory Visit
Admission: RE | Admit: 2016-05-17 | Discharge: 2016-05-17 | Disposition: A | Discharge status: Home / Self Care | Payer: Medicare Other | Source: Ambulatory Visit | Attending: Radiation Oncology | Admitting: Radiation Oncology

## 2016-05-17 DIAGNOSIS — C50412 Malignant neoplasm of upper-outer quadrant of left female breast: Secondary | ICD-10-CM | POA: Diagnosis not present

## 2016-05-17 DIAGNOSIS — Z510 Encounter for antineoplastic radiation therapy: Secondary | ICD-10-CM | POA: Diagnosis not present

## 2016-05-18 ENCOUNTER — Ambulatory Visit
Admission: RE | Admit: 2016-05-18 | Discharge: 2016-05-18 | Disposition: A | Discharge status: Home / Self Care | Payer: Medicare Other | Source: Ambulatory Visit | Attending: Radiation Oncology | Admitting: Radiation Oncology

## 2016-05-18 DIAGNOSIS — C50412 Malignant neoplasm of upper-outer quadrant of left female breast: Secondary | ICD-10-CM | POA: Diagnosis not present

## 2016-05-18 DIAGNOSIS — Z510 Encounter for antineoplastic radiation therapy: Secondary | ICD-10-CM | POA: Diagnosis not present

## 2016-05-19 ENCOUNTER — Ambulatory Visit
Admission: RE | Admit: 2016-05-19 | Discharge: 2016-05-19 | Disposition: A | Discharge status: Home / Self Care | Payer: Medicare Other | Source: Ambulatory Visit | Attending: Radiation Oncology | Admitting: Radiation Oncology

## 2016-05-19 ENCOUNTER — Encounter: Payer: Medicare Other | Admitting: Radiation Oncology

## 2016-05-19 DIAGNOSIS — Z510 Encounter for antineoplastic radiation therapy: Secondary | ICD-10-CM | POA: Diagnosis not present

## 2016-05-19 DIAGNOSIS — C50412 Malignant neoplasm of upper-outer quadrant of left female breast: Secondary | ICD-10-CM | POA: Diagnosis not present

## 2016-05-22 ENCOUNTER — Ambulatory Visit
Admission: RE | Admit: 2016-05-22 | Discharge: 2016-05-22 | Disposition: A | Discharge status: Home / Self Care | Payer: Medicare Other | Source: Ambulatory Visit | Attending: Radiation Oncology | Admitting: Radiation Oncology

## 2016-05-22 DIAGNOSIS — C50412 Malignant neoplasm of upper-outer quadrant of left female breast: Secondary | ICD-10-CM | POA: Diagnosis not present

## 2016-05-22 DIAGNOSIS — Z510 Encounter for antineoplastic radiation therapy: Secondary | ICD-10-CM | POA: Diagnosis not present

## 2016-05-23 ENCOUNTER — Encounter: Payer: Self-pay | Admitting: Radiation Oncology

## 2016-05-23 ENCOUNTER — Ambulatory Visit
Admission: RE | Admit: 2016-05-23 | Discharge: 2016-05-23 | Disposition: A | Discharge status: Home / Self Care | Payer: Medicare Other | Source: Ambulatory Visit | Attending: Radiation Oncology | Admitting: Radiation Oncology

## 2016-05-23 VITALS — BP 133/89 | HR 77 | Temp 97.6°F | Ht 64.5 in | Wt 152.6 lb

## 2016-05-23 DIAGNOSIS — C50412 Malignant neoplasm of upper-outer quadrant of left female breast: Secondary | ICD-10-CM

## 2016-05-23 DIAGNOSIS — Z510 Encounter for antineoplastic radiation therapy: Secondary | ICD-10-CM | POA: Diagnosis not present

## 2016-05-23 NOTE — Progress Notes (Signed)
  Radiation Oncology         (336) [phone] ________________________________  Name: Stacey Doyle MRN: [id number]  Date: 05/23/2016  DOB: September 14, 1946    Weekly Radiation Therapy Management    ICD-9-CM ICD-10-CM   1. Breast cancer of upper-outer quadrant of left female breast (HCC) 174.4 C50.412      Current Dose: 58.4 Gy     Planned Dose:  62.4 Gy  Narrative . . . . . . . . The patient presents for routine under treatment assessment.                                   Stacey Doyle has completed 32 fractions to her left breast. She denies having pain and fatigue. She is using sonafine as directed. The skin on her left breast is red. She has been given a one month follow up appointment.                                  Set-up films were reviewed.                                 The chart was checked. Physical Findings. . .  height is 5' 4.5" (1.638 m) and weight is 152 lb 9.6 oz (69.219 kg). Her oral temperature is 97.6 F (36.4 C). Her blood pressure is 133/89 and her pulse is 77. Her oxygen saturation is 100%. . Weight essentially stable.  No significant changes. Alert and in no distress. Left breast shows erythema, particularly in the upper inner quadrant.  No moist desquamation Impression . . . . . . . The patient is tolerating radiation. Plan . . . . . . . . . . . . Continue treatment as planned. The patient will complete treatment later this week and return for follow up in 1 month.  ________________________________   Blair Promise, PhD, MD   This document serves as a record of services personally performed by Gery Pray, MD. It was created on his behalf by Arlyce Harman, a trained medical scribe. The creation of this record is based on the scribe's personal observations and the provider's statements to them. This document has been checked and approved by the attending provider.

## 2016-05-23 NOTE — Progress Notes (Signed)
Stacey Doyle has completed 32 fractions to her left breast.  She denies having pain and fatigue.  She is using sonafine as directed.  The skin on her left breast is red.  She has been given a one month follow up appointment.  BP 133/89 mmHg  Pulse 77  Temp(Src) 97.6 F (36.4 C) (Oral)  Ht 5' 4.5" (1.638 m)  Wt 152 lb 9.6 oz (69.219 kg)  BMI 25.80 kg/m2  SpO2 100%   Wt Readings from Last 3 Encounters:  05/23/16 152 lb 9.6 oz (69.219 kg)  05/12/16 150 lb 12.8 oz (68.402 kg)  05/09/16 151 lb 6.4 oz (68.675 kg)

## 2016-05-24 ENCOUNTER — Ambulatory Visit
Admission: RE | Admit: 2016-05-24 | Discharge: 2016-05-24 | Disposition: A | Discharge status: Home / Self Care | Payer: Medicare Other | Source: Ambulatory Visit | Attending: Radiation Oncology | Admitting: Radiation Oncology

## 2016-05-24 DIAGNOSIS — Z510 Encounter for antineoplastic radiation therapy: Secondary | ICD-10-CM | POA: Diagnosis not present

## 2016-05-24 DIAGNOSIS — C50412 Malignant neoplasm of upper-outer quadrant of left female breast: Secondary | ICD-10-CM | POA: Diagnosis not present

## 2016-05-25 ENCOUNTER — Telehealth: Payer: Self-pay | Admitting: *Deleted

## 2016-05-25 ENCOUNTER — Ambulatory Visit
Admission: RE | Admit: 2016-05-25 | Discharge: 2016-05-25 | Disposition: A | Discharge status: Home / Self Care | Payer: Medicare Other | Source: Ambulatory Visit | Attending: Radiation Oncology | Admitting: Radiation Oncology

## 2016-05-25 ENCOUNTER — Encounter: Payer: Self-pay | Admitting: Radiation Oncology

## 2016-05-25 DIAGNOSIS — C50412 Malignant neoplasm of upper-outer quadrant of left female breast: Secondary | ICD-10-CM | POA: Diagnosis not present

## 2016-05-25 DIAGNOSIS — Z510 Encounter for antineoplastic radiation therapy: Secondary | ICD-10-CM | POA: Diagnosis not present

## 2016-05-25 NOTE — Telephone Encounter (Signed)
Called pt to congratulate on completion of xrt. Unable to leave msg. Will call again.

## 2016-05-26 ENCOUNTER — Telehealth: Payer: Self-pay | Admitting: *Deleted

## 2016-05-26 NOTE — Telephone Encounter (Signed)
  Oncology Nurse Navigator Documentation    Navigator Encounter Type: Telephone (05/26/16 1100) Telephone: Outgoing Call (05/26/16 1100)         Patient Visit Type: [id number] (05/26/16 1100) Treatment Phase: Final Radiation Tx (05/26/16 1100) Barriers/Navigation Needs: No barriers at this time;No Questions;No Needs (05/26/16 1100)   Interventions: Referrals (05/26/16 1100) Referrals: Survivorship (05/26/16 1100)          Acuity: Level 1 (05/26/16 1100)         Time Spent with Patient: 15 (05/26/16 1100)

## 2016-06-05 ENCOUNTER — Other Ambulatory Visit: Payer: Self-pay | Admitting: *Deleted

## 2016-06-05 ENCOUNTER — Other Ambulatory Visit: Payer: Self-pay | Admitting: Hematology and Oncology

## 2016-06-05 ENCOUNTER — Ambulatory Visit (HOSPITAL_BASED_OUTPATIENT_CLINIC_OR_DEPARTMENT_OTHER): Payer: Medicare Other | Admitting: Hematology and Oncology

## 2016-06-05 ENCOUNTER — Encounter: Payer: Self-pay | Admitting: Hematology and Oncology

## 2016-06-05 ENCOUNTER — Telehealth: Payer: Self-pay | Admitting: Hematology and Oncology

## 2016-06-05 DIAGNOSIS — C50412 Malignant neoplasm of upper-outer quadrant of left female breast: Secondary | ICD-10-CM

## 2016-06-05 MED ORDER — ANASTROZOLE 1 MG PO TABS: 1.0000 mg | ORAL_TABLET | Freq: Every day | ORAL | 3 refills | Status: DC

## 2016-06-05 NOTE — Progress Notes (Signed)
Patient Care Team: [name], MD as PCP - General (Internal Medicine)  SUMMARY OF ONCOLOGIC HISTORY:   Breast cancer of upper-outer quadrant of left female breast ([place])   12/30/2015 Initial Diagnosis    Mammogram: Architectural distortion with spiculated and indistinct margin left breast central, 1 cm mass; biopsy: Invasive ductal carcinoma grade 1-2, with DCIS, LVID present, ER positive PR negative HER-2 negative Ki-67 5%, T1BN0 stage IA clinical stage     02/24/2016 Surgery    Left lumpectomy: Invasive ductal carcinoma grade 1, 1.3 cm, with low-grade DCIS, broadly less than 0.1 minutes to posterior margin, 0/1 lymph node, T1 cN0 stage IA     03/21/2016 Oncotype testing    Oncotype DX score 21, 13% risk of distant recurrence with antiestrogen therapy      04/06/2016 - 05/25/2016 Radiation Therapy    Adjuvant radiation therapy     06/05/2016 -  Anti-estrogen oral therapy    Adjuvant antiestrogen therapy with anastrozole 1 mg by mouth daily 5 years     CHIEF COMPLIANT: Follow-up after radiation therapy  INTERVAL HISTORY: Stacey Doyle is a 70 year old with above-mentioned history of left breast cancer with completed adjuvant radiation therapy and is here today to discuss starting antiestrogen therapy. She done extremely well from radiation. She reports very minimal discomfort or redness. She does not have any pain in the breast. Surgical scars are nearly gone.  REVIEW OF SYSTEMS:   Constitutional: Denies fevers, chills or abnormal weight loss Eyes: Denies blurriness of vision Ears, nose, mouth, throat, and face: Denies mucositis or sore throat Respiratory: Denies cough, dyspnea or wheezes Cardiovascular: Denies palpitation, chest discomfort Gastrointestinal:  Denies nausea, heartburn or change in bowel habits Skin: Denies abnormal skin rashes Lymphatics: Denies new lymphadenopathy or easy bruising Neurological:Denies numbness, tingling or new weaknesses Behavioral/Psych:  Mood is stable, no new changes  Extremities: No lower extremity edema Breast:  denies any pain or lumps or nodules in either breasts All other systems were reviewed with the patient and are negative.  I have reviewed the past medical history, past surgical history, social history and family history with the patient and they are unchanged from previous note.  ALLERGIES:  is allergic to percocet [oxycodone-acetaminophen].  MEDICATIONS:  Current Outpatient Prescriptions  Medication Sig Dispense Refill  . [START ON 06/26/2016] anastrozole (ARIMIDEX) 1 MG tablet Take 1 tablet (1 mg total) by mouth daily. 90 tablet 3  . atorvastatin (LIPITOR) 10 MG tablet Take 10 mg by mouth daily. Reported on 03/28/2016    . Biotin 1 MG CAPS Take by mouth.    . calcium carbonate (OS-CAL - DOSED IN MG OF ELEMENTAL CALCIUM) 1250 MG tablet Take 1 tablet by mouth.    . cetirizine (ZYRTEC) 10 MG tablet Take 10 mg by mouth daily.    . cholecalciferol (VITAMIN D) 1000 UNITS tablet Take 1,000 Units by mouth daily.    . clonazePAM (KLONOPIN) 0.5 MG tablet Take 0.5 mg by mouth daily.    . Cyanocobalamin (VITAMIN B-12) 5000 MCG LOZG Take 5,000 mg by mouth.    Marland Kitchen glucosamine-chondroitin 500-400 MG tablet Take 1 tablet by mouth 3 (three) times daily.    Marland Kitchen olmesartan (BENICAR) 20 MG tablet Take 20 mg by mouth daily.    . vitamin C (ASCORBIC ACID) 250 MG tablet Take 250 mg by mouth daily.    . vitamin E 400 UNIT capsule Take 400 Units by mouth daily.     No current facility-administered medications for this visit.  PHYSICAL EXAMINATION: ECOG PERFORMANCE STATUS: 1 - Symptomatic but completely ambulatory  Vitals:   06/05/16 1144  BP: 137/76  Pulse: 90  Resp: 18  Temp: 98 F (36.7 C)   Filed Weights   06/05/16 1144  Weight: 150 lb 1.6 oz (68.1 kg)    GENERAL:alert, no distress and comfortable SKIN: skin color, texture, turgor are normal, no rashes or significant lesions EYES: normal, Conjunctiva are pink and  non-injected, sclera clear OROPHARYNX:no exudate, no erythema and lips, buccal mucosa, and tongue normal  NECK: supple, thyroid normal size, non-tender, without nodularity LYMPH:  no palpable lymphadenopathy in the cervical, axillary or inguinal LUNGS: clear to auscultation and percussion with normal breathing effort HEART: regular rate & rhythm and no murmurs and no lower extremity edema ABDOMEN:abdomen soft, non-tender and normal bowel sounds MUSCULOSKELETAL:no cyanosis of digits and no clubbing  NEURO: alert & oriented x 3 with fluent speech, no focal motor/sensory deficits EXTREMITIES: No lower extremity edema   LABORATORY DATA:  I have reviewed the data as listed   Chemistry      Component Value Date/Time   NA 145 02/18/2016 1300   K 4.6 02/18/2016 1300   CL 106 02/18/2016 1300   CO2 28 02/18/2016 1300   BUN 17 02/18/2016 1300   CREATININE 0.78 02/18/2016 1300      Component Value Date/Time   CALCIUM 10.2 02/18/2016 1300   ALKPHOS 48 02/18/2016 1300   AST 37 02/18/2016 1300   ALT 36 02/18/2016 1300   BILITOT 0.8 02/18/2016 1300       Lab Results  Component Value Date   WBC 4.8 02/18/2016   HGB 12.3 02/18/2016   HCT 39.2 02/18/2016   MCV 95.4 02/18/2016   PLT 161 02/18/2016   NEUTROABS 1.8 02/18/2016     ASSESSMENT & PLAN:  Breast cancer of upper-outer quadrant of left female breast ([place]) Left lumpectomy 02/24/2016: Invasive ductal carcinoma grade 1, 1.3 cm, with low-grade DCIS, broadly less than 0.1 minutes to posterior margin (did not need re-excision), 0/1 lymph node, T1 cN0 stage IA, ER 95%, PR 0%, HER-2 negative ratio 1.15, Ki-67 5% Oncotype DX score 21, 13% risk of distant recurrence with antiestrogen therapy  Adjuvant radiation therapy from 04/06/2016 to 05/25/2016  Treatment plan: Adjuvant antiestrogen therapy with anastrozole 1 mg daily to start 06/13/2016 Anastrozole counseling:We discussed the risks and benefits of anti-estrogen therapy with  aromatase inhibitors. These include but not limited to insomnia, hot flashes, mood changes, vaginal dryness, bone density loss, and weight gain. We strongly believe that the benefits far outweigh the risks. Patient understands these risks and consented to starting treatment. Planned treatment duration is 5 years.  Patient is an extremely active lady who exercises very regularly and [place] [facility] [place]   No orders of the defined types were placed in this encounter.  The patient has a good understanding of the overall plan. she agrees with it. she will call with any problems that may develop before the next visit here.   Rulon Eisenmenger, MD 06/05/16

## 2016-06-05 NOTE — Assessment & Plan Note (Signed)
Left lumpectomy 02/24/2016: Invasive ductal carcinoma grade 1, 1.3 cm, with low-grade DCIS, broadly less than 0.1 minutes to posterior margin (did not need re-excision), 0/1 lymph node, T1 cN0 stage IA, ER 95%, PR 0%, HER-2 negative ratio 1.15, Ki-67 5%  Adjuvant radiation therapy from 04/06/2016 to 05/25/2016  Treatment plan: Adjuvant antiestrogen therapy with anastrozole 1 mg daily to start 06/13/2016 Anastrozole counseling:We discussed the risks and benefits of anti-estrogen therapy with aromatase inhibitors. These include but not limited to insomnia, hot flashes, mood changes, vaginal dryness, bone density loss, and weight gain. We strongly believe that the benefits far outweigh the risks. Patient understands these risks and consented to starting treatment. Planned treatment duration is 5 years.  Patient is an extremely active lady who exercises very regularly and enjoys her time at a [place] [facility] [place]).

## 2016-06-05 NOTE — Telephone Encounter (Signed)
appt made and avs printed. Per VG scheduled follow up for 4 months rather then 6 month on pof

## 2016-06-06 ENCOUNTER — Telehealth: Payer: Self-pay | Admitting: Hematology and Oncology

## 2016-06-06 NOTE — Telephone Encounter (Signed)
appt made per pof. Calendar sent to pt by mail

## 2016-06-08 NOTE — Progress Notes (Signed)
°  Radiation Oncology         [phone] ________________________________  Name: Stacey Doyle MRN: [id number]  Date: 05/25/2016  DOB: 1946-08-05  End of Treatment Note    ICD-9-CM ICD-10-CM   1. Breast cancer of upper-outer quadrant of left female breast ([place]) 174.4 C50.412     DIAGNOSIS: Stage IA invasive ductal carcinoma of the left breast     Indication for treatment: Curative       Radiation treatment dates:   04/06/2016-05/25/2016  Site/dose:   1.) Left breast, 50.4 Gy at 24 fractions 2.) Left breast boost, 12 Gy in 6 fractions.   Beams/energy:   1.) 3D, 6X, 2.) En Face, 9 MeV   Narrative: The patient tolerated radiation treatment relatively well.  The patient did complain of redness of her breast during treatment.    Plan: The patient has completed radiation treatment. The patient will return to radiation oncology clinic for routine followup in one month. I advised them to call or return sooner if they have any questions or concerns related to their recovery or treatment.  -----------------------------------  Blair Promise, PhD, MD  This document serves as a record of services personally performed by Gery Pray, MD. It was created on his behalf by Truddie Hidden, a trained medical scribe. The creation of this record is based on the scribe's personal observations and the provider's statements to them. This document has been checked and approved by the attending provider.

## 2016-06-19 IMAGING — CR DG CHEST 2V
2 series · 2 of 2 positions shown · non-contrast
Comparison: None.

CLINICAL DATA: Breast cancer, preop

EXAM:
CHEST  2 VIEW

[view not recorded (1 of 2)]
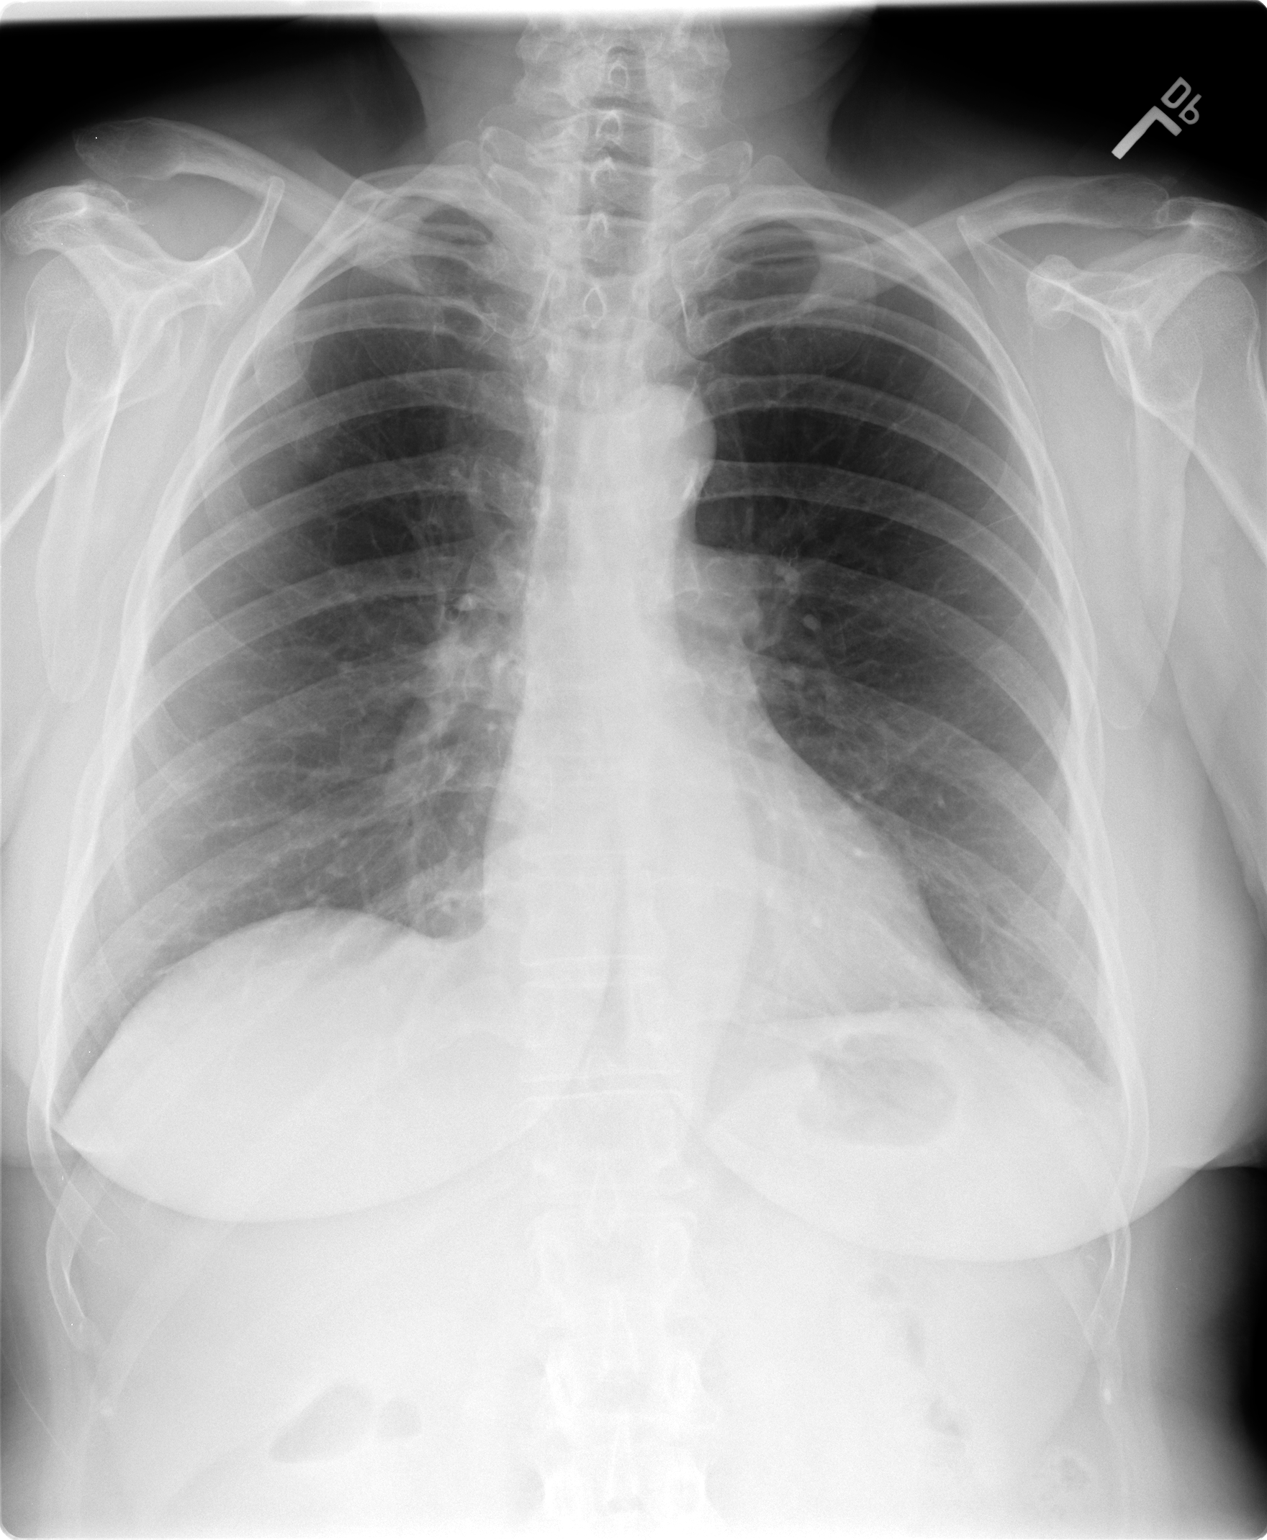

[view not recorded (2 of 2)]
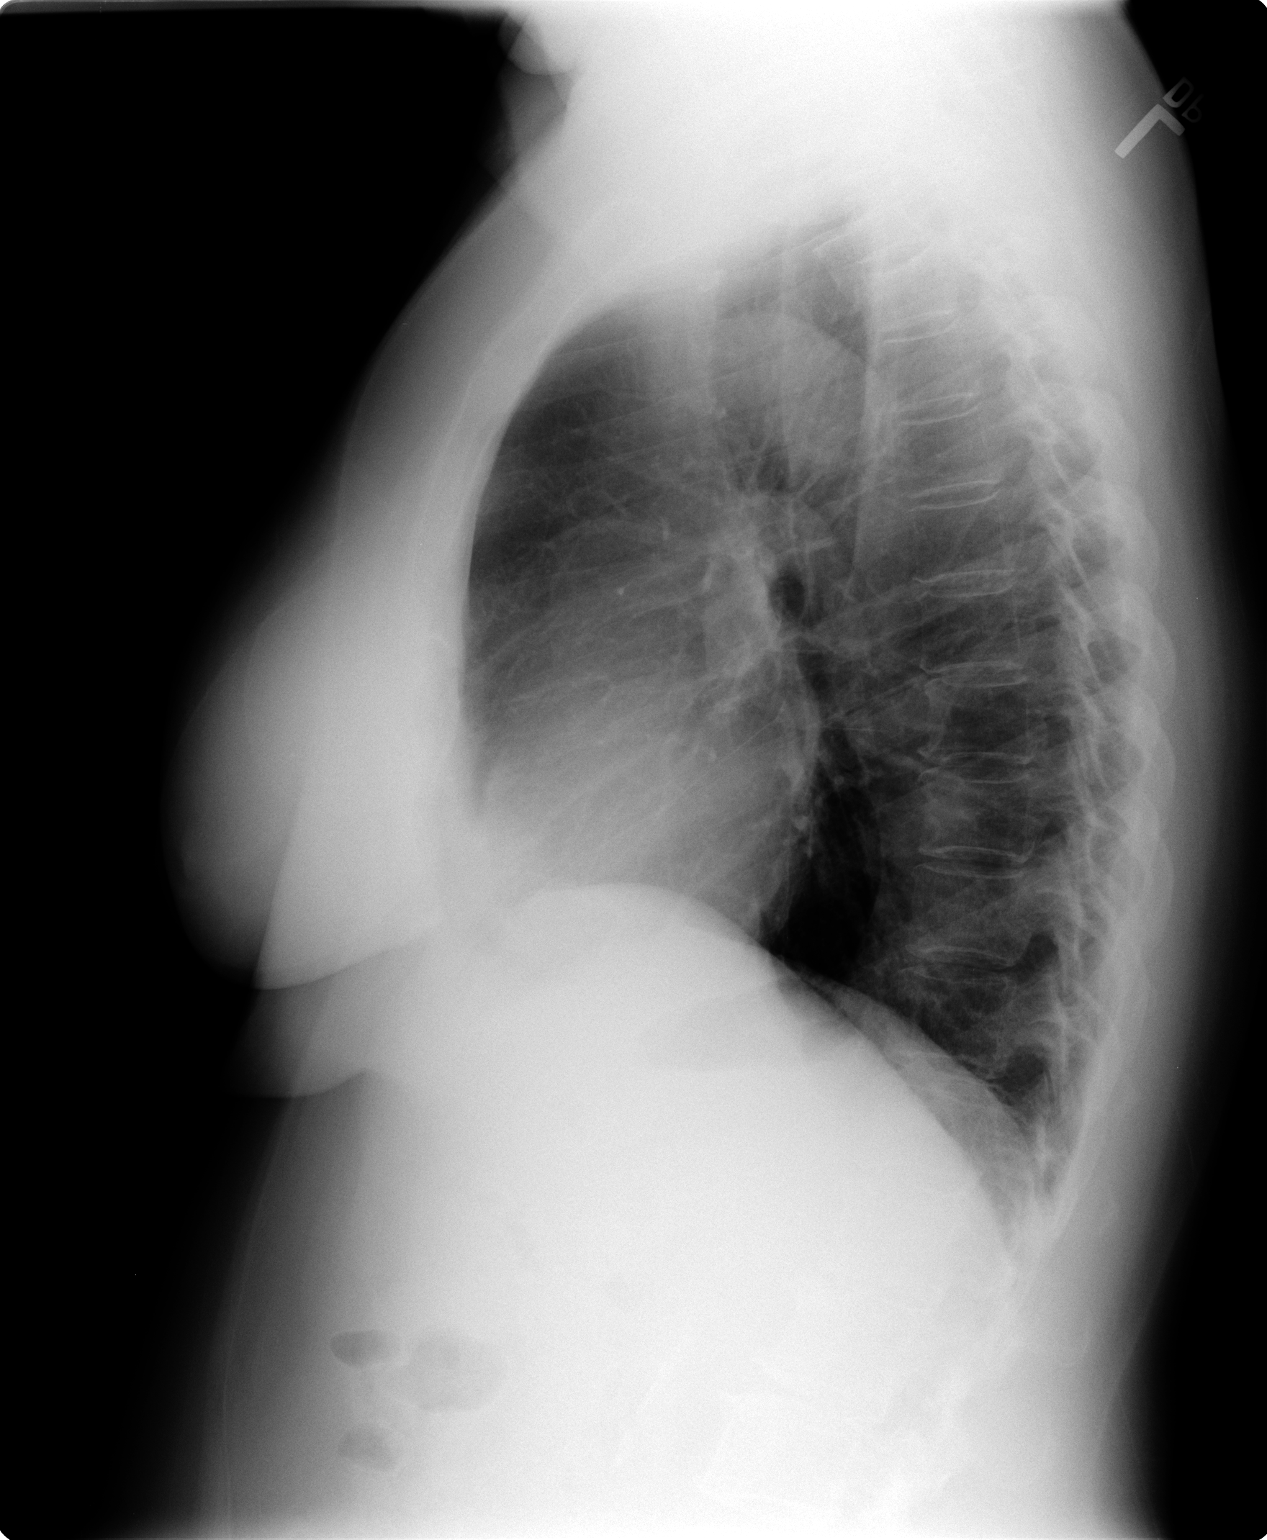

[2 of 2 positions shown; findings below may reference images not displayed]

FINDINGS: Normal heart size and mediastinal contours. No acute infiltrate or
edema. No effusion or pneumothorax. Remote right rib fractures and
AC joint separation. No acute osseous findings. Breast implants.
IMPRESSION: No active disease.

## 2016-06-29 ENCOUNTER — Ambulatory Visit: Payer: Self-pay | Admitting: Radiation Oncology

## 2016-07-05 ENCOUNTER — Encounter: Payer: Self-pay | Admitting: Oncology

## 2016-07-06 ENCOUNTER — Ambulatory Visit
Admission: RE | Admit: 2016-07-06 | Discharge: 2016-07-06 | Disposition: A | Discharge status: Home / Self Care | Payer: Medicare Other | Source: Ambulatory Visit | Attending: Radiation Oncology | Admitting: Radiation Oncology

## 2016-07-06 ENCOUNTER — Encounter: Payer: Self-pay | Admitting: Radiation Oncology

## 2016-07-06 VITALS — BP 124/99 | HR 53 | Temp 97.8°F | Ht 64.5 in | Wt 150.2 lb

## 2016-07-06 DIAGNOSIS — C50412 Malignant neoplasm of upper-outer quadrant of left female breast: Secondary | ICD-10-CM | POA: Diagnosis not present

## 2016-07-06 NOTE — Progress Notes (Addendum)
Stacey Doyle here for follow up.  She denies having pain.  She is not taking Arimidex due to the side effects.  She denies having fatigue.  The skin on her left breast has slight hyperpigmentation.  BP (!) 124/99 (BP Location: Right Arm, Patient Position: Sitting)   Pulse (!) 53   Temp 97.8 F (36.6 C) (Oral)   Ht 5' 4.5" (1.638 m)   Wt 150 lb 3.2 oz (68.1 kg)   SpO2 95%   BMI 25.38 kg/m    Wt Readings from Last 3 Encounters:  07/06/16 150 lb 3.2 oz (68.1 kg)  06/05/16 150 lb 1.6 oz (68.1 kg)  05/23/16 152 lb 9.6 oz (69.2 kg)

## 2016-07-06 NOTE — Progress Notes (Signed)
Radiation Oncology         [phone] ________________________________  Name: Stacey Doyle MRN: [id number]  Date: 07/06/2016  DOB: Apr 30, 1946  Follow-Up Visit Note  CC: [name], MD  Tisovec, [name], MD    ICD-9-CM ICD-10-CM   1. Breast cancer of upper-outer quadrant of left female breast ([place]) 174.4 C50.412     Diagnosis: Stage Ia (pT1c, pN0) invasive ductal carcinoma of the left breast (ER +, PR -, HER2 -)  Interval Since Last Radiation: 5 weeks  04/06/2016-05/25/2016  1.) Left breast, 50.4 Gy at 24 fractions  2.) Left breast boost, 12 Gy in 6 fractions.   Narrative:  The patient returns today for routine follow-up. She denies having pain. She is not taking Arimidex due to the side effects; such as vaginal dryness, muscle pain, hot flashes, etc. She denies having fatigue.  ALLERGIES:  is allergic to percocet [oxycodone-acetaminophen].  Meds: Current Outpatient Prescriptions  Medication Sig Dispense Refill  . atorvastatin (LIPITOR) 10 MG tablet Take 10 mg by mouth daily. Reported on 03/28/2016    . Biotin 1 MG CAPS Take by mouth.    . calcium carbonate (OS-CAL - DOSED IN MG OF ELEMENTAL CALCIUM) 1250 MG tablet Take 1 tablet by mouth.    . cetirizine (ZYRTEC) 10 MG tablet Take 10 mg by mouth daily.    . cholecalciferol (VITAMIN D) 1000 UNITS tablet Take 1,000 Units by mouth daily.    . clonazePAM (KLONOPIN) 0.5 MG tablet Take 0.5 mg by mouth daily.    . Cyanocobalamin (VITAMIN B-12) 5000 MCG LOZG Take 5,000 mg by mouth.    Marland Kitchen glucosamine-chondroitin 500-400 MG tablet Take 1 tablet by mouth 3 (three) times daily.    . NON FORMULARY Bee pollen    . olmesartan (BENICAR) 20 MG tablet Take 20 mg by mouth daily.    . vitamin C (ASCORBIC ACID) 250 MG tablet Take 250 mg by mouth daily.    . vitamin E 400 UNIT capsule Take 400 Units by mouth daily.    Marland Kitchen anastrozole (ARIMIDEX) 1 MG tablet Take 1 tablet (1 mg total) by mouth daily. (Patient not taking: Reported on  07/06/2016) 90 tablet 3   No current facility-administered medications for this encounter.     Physical Findings: The patient is in no acute distress. Patient is alert and oriented.  height is 5' 4.5" (1.638 m) and weight is 150 lb 3.2 oz (68.1 kg). Her oral temperature is 97.8 F (36.6 C). Her blood pressure is 124/99 (abnormal) and her pulse is 53 (abnormal). Her oxygen saturation is 95%.   Lungs are clear to auscultation bilaterally. Heart has regular rate and rhythm. No palpable cervical, supraclavicular, or axillary adenopathy. Left Breast: Skin has healed well. Faint hyperpigmentation changes. No nipple bleeding/discharge. No dominant mass within the left breast.  Lab Findings: Lab Results  Component Value Date   WBC 4.8 02/18/2016   HGB 12.3 02/18/2016   HCT 39.2 02/18/2016   MCV 95.4 02/18/2016   PLT 161 02/18/2016    Radiographic Findings: No results found.  Impression: No sign of recurrence on clinical exam.  Plan: Survivorship is scheduled on 08/03/16 and she is scheduled to follow up with Dr. Lindi Adie on 10/02/16. Routine follow up in 6 months.  ____________________________________ -----------------------------------  Blair Promise, PhD, MD  This document serves as a record of services personally performed by Gery Pray, MD. It was created on his behalf by Darcus Austin, a trained medical scribe. The creation  of this record is based on the scribe's personal observations and the provider's statements to them. This document has been checked and approved by the attending provider.

## 2016-07-07 ENCOUNTER — Telehealth: Payer: Self-pay | Admitting: Hematology and Oncology

## 2016-07-07 NOTE — Telephone Encounter (Signed)
09.21.2017 Appointment canceled per patient request. Patient does not feel the need to attend appointment.

## 2016-07-18 DIAGNOSIS — N390 Urinary tract infection, site not specified: Secondary | ICD-10-CM | POA: Diagnosis not present

## 2016-07-18 DIAGNOSIS — E7800 Pure hypercholesterolemia, unspecified: Secondary | ICD-10-CM | POA: Diagnosis not present

## 2016-07-18 DIAGNOSIS — R7301 Impaired fasting glucose: Secondary | ICD-10-CM | POA: Diagnosis not present

## 2016-07-18 DIAGNOSIS — I10 Essential (primary) hypertension: Secondary | ICD-10-CM | POA: Diagnosis not present

## 2016-07-18 DIAGNOSIS — R8299 Other abnormal findings in urine: Secondary | ICD-10-CM | POA: Diagnosis not present

## 2016-07-18 DIAGNOSIS — M859 Disorder of bone density and structure, unspecified: Secondary | ICD-10-CM | POA: Diagnosis not present

## 2016-07-25 DIAGNOSIS — E7800 Pure hypercholesterolemia, unspecified: Secondary | ICD-10-CM | POA: Diagnosis not present

## 2016-07-25 DIAGNOSIS — M1712 Unilateral primary osteoarthritis, left knee: Secondary | ICD-10-CM | POA: Diagnosis not present

## 2016-07-25 DIAGNOSIS — N898 Other specified noninflammatory disorders of vagina: Secondary | ICD-10-CM | POA: Diagnosis not present

## 2016-07-25 DIAGNOSIS — I10 Essential (primary) hypertension: Secondary | ICD-10-CM | POA: Diagnosis not present

## 2016-07-25 DIAGNOSIS — Z23 Encounter for immunization: Secondary | ICD-10-CM | POA: Diagnosis not present

## 2016-07-25 DIAGNOSIS — Z0000 Encounter for general adult medical examination without abnormal findings: Secondary | ICD-10-CM | POA: Diagnosis not present

## 2016-07-25 DIAGNOSIS — R7301 Impaired fasting glucose: Secondary | ICD-10-CM | POA: Diagnosis not present

## 2016-07-25 DIAGNOSIS — C50919 Malignant neoplasm of unspecified site of unspecified female breast: Secondary | ICD-10-CM | POA: Diagnosis not present

## 2016-07-25 DIAGNOSIS — M859 Disorder of bone density and structure, unspecified: Secondary | ICD-10-CM | POA: Diagnosis not present

## 2016-07-25 DIAGNOSIS — D126 Benign neoplasm of colon, unspecified: Secondary | ICD-10-CM | POA: Diagnosis not present

## 2016-07-25 DIAGNOSIS — Z6825 Body mass index (BMI) 25.0-25.9, adult: Secondary | ICD-10-CM | POA: Diagnosis not present

## 2016-08-03 ENCOUNTER — Encounter: Payer: Medicare Other | Admitting: Adult Health

## 2016-09-22 ENCOUNTER — Encounter (HOSPITAL_COMMUNITY): Payer: Self-pay

## 2016-09-27 DIAGNOSIS — Z853 Personal history of malignant neoplasm of breast: Secondary | ICD-10-CM | POA: Diagnosis not present

## 2016-10-01 NOTE — Assessment & Plan Note (Signed)
Left lumpectomy 02/24/2016: Invasive ductal carcinoma grade 1, 1.3 cm, with low-grade DCIS, broadly less than 0.1 minutes to posterior margin (did not need re-excision), 0/1 lymph node, T1 cN0 stage IA, ER 95%, PR 0%, HER-2 negative ratio 1.15, Ki-67 5% Oncotype DX score 21, 13% risk of distant recurrence with antiestrogen therapy  Adjuvant radiation therapy from 04/06/2016 to 05/25/2016  Treatment plan: Adjuvant antiestrogen therapy with anastrozole 1 mg daily started 06/13/2016 Anastrozole Toxicities:  RTC in 6 months

## 2016-10-02 ENCOUNTER — Ambulatory Visit (HOSPITAL_BASED_OUTPATIENT_CLINIC_OR_DEPARTMENT_OTHER): Payer: Medicare Other | Admitting: Hematology and Oncology

## 2016-10-02 ENCOUNTER — Encounter: Payer: Self-pay | Admitting: Hematology and Oncology

## 2016-10-02 DIAGNOSIS — Z170 Estrogen receptor positive status [ER+]: Secondary | ICD-10-CM

## 2016-10-02 DIAGNOSIS — C50412 Malignant neoplasm of upper-outer quadrant of left female breast: Secondary | ICD-10-CM | POA: Diagnosis not present

## 2016-10-02 MED ORDER — TAMOXIFEN CITRATE 20 MG PO TABS: 20.0000 mg | ORAL_TABLET | Freq: Every day | ORAL | 3 refills | Status: AC

## 2016-10-02 NOTE — Progress Notes (Signed)
Patient Care Team: [name], MD as PCP - General (Internal Medicine)  DIAGNOSIS:  Encounter Diagnosis  Name Primary?  . Malignant neoplasm of upper-outer quadrant of left breast in female, estrogen receptor positive ([place])     SUMMARY OF ONCOLOGIC HISTORY:   Breast cancer of upper-outer quadrant of left female breast ([name])   12/30/2015 Initial Diagnosis    Mammogram: Architectural distortion with spiculated and indistinct margin left breast central, 1 cm mass; biopsy: Invasive ductal carcinoma grade 1-2, with DCIS, LVID present, ER positive PR negative HER-2 negative Ki-67 5%, T1BN0 stage IA clinical stage      02/24/2016 Surgery    Left lumpectomy: Invasive ductal carcinoma grade 1, 1.3 cm, with low-grade DCIS, broadly less than 0.1 minutes to posterior margin, 0/1 lymph node, T1 cN0 stage IA      03/21/2016 Oncotype testing    Oncotype DX score 21, 13% risk of distant recurrence with antiestrogen therapy       04/06/2016 - 05/25/2016 Radiation Therapy    Adjuvant radiation therapy      06/05/2016 -  Anti-estrogen oral therapy    Adjuvant antiestrogen therapy with anastrozole 1 mg , treatment changed to tamoxifen 20 mg daily 10/02/2016       CHIEF COMPLIANT: Patient could not tolerate anastrozole.  INTERVAL HISTORY: Stacey Doyle is a 56 with above-mentioned history of left breast cancer treated with lumpectomy adjuvant radiation and started anastrozole in July 2017. She only took it for 2 weeks and stopped taking it because it causes hot flashes or muscle aches and pains and decreased energy levels along with painful sexual intercourse. She is here to discuss alternate treatments. Since she stopped it her symptoms have resolved. 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 and stays busy with her grandchildren  REVIEW OF SYSTEMS:   Constitutional: Denies fevers, chills or abnormal weight loss Eyes: Denies blurriness of vision Ears, nose, mouth, throat,  and face: Denies mucositis or sore throat Respiratory: Denies cough, dyspnea or wheezes Cardiovascular: Denies palpitation, chest discomfort Gastrointestinal:  Denies nausea, heartburn or change in bowel habits Skin: Denies abnormal skin rashes Lymphatics: Denies new lymphadenopathy or easy bruising Neurological:Denies numbness, tingling or new weaknesses Behavioral/Psych: Mood is stable, no new changes  Extremities: No lower extremity edema Breast:  denies any pain or lumps or nodules in either breasts All other systems were reviewed with the patient and are negative.  I have reviewed the past medical history, past surgical history, social history and family history with the patient and they are unchanged from previous note.  ALLERGIES:  is allergic to percocet [oxycodone-acetaminophen].  MEDICATIONS:  Current Outpatient Prescriptions  Medication Sig Dispense Refill  . atorvastatin (LIPITOR) 10 MG tablet Take 10 mg by mouth daily. Reported on 03/28/2016    . Biotin 1 MG CAPS Take by mouth.    . calcium carbonate (OS-CAL - DOSED IN MG OF ELEMENTAL CALCIUM) 1250 MG tablet Take 1 tablet by mouth.    . cetirizine (ZYRTEC) 10 MG tablet Take 10 mg by mouth daily.    . cholecalciferol (VITAMIN D) 1000 UNITS tablet Take 1,000 Units by mouth daily.    . clonazePAM (KLONOPIN) 0.5 MG tablet Take 0.5 mg by mouth daily.    . Cyanocobalamin (VITAMIN B-12) 5000 MCG LOZG Take 5,000 mg by mouth.    Marland Kitchen glucosamine-chondroitin 500-400 MG tablet Take 1 tablet by mouth 3 (three) times daily.    . NON FORMULARY Bee pollen    .  olmesartan (BENICAR) 20 MG tablet Take 20 mg by mouth daily.    . tamoxifen (NOLVADEX) 20 MG tablet Take 1 tablet (20 mg total) by mouth daily. 90 tablet 3  . vitamin C (ASCORBIC ACID) 250 MG tablet Take 250 mg by mouth daily.    . vitamin E 400 UNIT capsule Take 400 Units by mouth daily.     No current facility-administered medications for this visit.     PHYSICAL  EXAMINATION: ECOG PERFORMANCE STATUS: 1 - Symptomatic but completely ambulatory  Vitals:   10/02/16 0845  BP: (!) 150/79  Pulse: 65  Resp: 18  Temp: 97.5 F (36.4 C)   Filed Weights   10/02/16 0845  Weight: 147 lb 12.8 oz (67 kg)    GENERAL:alert, no distress and comfortable SKIN: skin color, texture, turgor are normal, no rashes or significant lesions EYES: normal, Conjunctiva are pink and non-injected, sclera clear OROPHARYNX:no exudate, no erythema and lips, buccal mucosa, and tongue normal  NECK: supple, thyroid normal size, non-tender, without nodularity LYMPH:  no palpable lymphadenopathy in the cervical, axillary or inguinal LUNGS: clear to auscultation and percussion with normal breathing effort HEART: regular rate & rhythm and no murmurs and no lower extremity edema ABDOMEN:abdomen soft, non-tender and normal bowel sounds MUSCULOSKELETAL:no cyanosis of digits and no clubbing  NEURO: alert & oriented x 3 with fluent speech, no focal motor/sensory deficits EXTREMITIES: No lower extremity edema BREAST: No palpable masses or nodules in either right or left breasts. No palpable axillary supraclavicular or infraclavicular adenopathy no breast tenderness or nipple discharge. (exam performed in the presence of a chaperone)  LABORATORY DATA:  I have reviewed the data as listed   Chemistry      Component Value Date/Time   NA 145 02/18/2016 1300   K 4.6 02/18/2016 1300   CL 106 02/18/2016 1300   CO2 28 02/18/2016 1300   BUN 17 02/18/2016 1300   CREATININE 0.78 02/18/2016 1300      Component Value Date/Time   CALCIUM 10.2 02/18/2016 1300   ALKPHOS 48 02/18/2016 1300   AST 37 02/18/2016 1300   ALT 36 02/18/2016 1300   BILITOT 0.8 02/18/2016 1300       Lab Results  Component Value Date   WBC 4.8 02/18/2016   HGB 12.3 02/18/2016   HCT 39.2 02/18/2016   MCV 95.4 02/18/2016   PLT 161 02/18/2016   NEUTROABS 1.8 02/18/2016     ASSESSMENT & PLAN:  Breast cancer of  upper-outer quadrant of left female breast ([place]) Left lumpectomy 02/24/2016: Invasive ductal carcinoma grade 1, 1.3 cm, with low-grade DCIS, broadly less than 0.1 minutes to posterior margin (did not need re-excision), 0/1 lymph node, T1 cN0 stage IA, ER 95%, PR 0%, HER-2 negative ratio 1.15, Ki-67 5% Oncotype DX score 21, 13% risk of distant recurrence with antiestrogen therapy  Adjuvant radiation therapy from 04/06/2016 to 05/25/2016  Treatment plan: Adjuvant antiestrogen therapy with anastrozole 1 mg daily started 06/13/2016 Anastrozole Toxicities: Profound hot flashes, arthralgias and myalgias, painful sexual intercourse I recommended changing treatment to tamoxifen. She will start taking 10 mg daily for the first month and if she tolerates it well she will increase it to 20 mg daily. I discussed the risk of recurrence if she did not take antiestrogen therapies as high as 26%. I instructed her that she will experience some side effects but they do get better over time and that she should persist on taking the medication for little longer than previously.  We  discussed the risks and benefits of tamoxifen. These include but not limited to insomnia, hot flashes, mood changes, vaginal dryness, and weight gain. Although rare, serious side effects including endometrial cancer, risk of blood clots were also discussed. We strongly believe that the benefits far outweigh the risks. Patient understands these risks and consented to starting treatment. Planned treatment duration is 5-10 years.  RTC in 3 months   No orders of the defined types were placed in this encounter.  The patient has a good understanding of the overall plan. she agrees with it. she will call with any problems that may develop before the next visit here.   Rulon Eisenmenger, MD 10/02/16

## 2016-10-30 DIAGNOSIS — L570 Actinic keratosis: Secondary | ICD-10-CM | POA: Diagnosis not present

## 2016-10-30 DIAGNOSIS — C44729 Squamous cell carcinoma of skin of left lower limb, including hip: Secondary | ICD-10-CM | POA: Diagnosis not present

## 2016-10-30 DIAGNOSIS — D0472 Carcinoma in situ of skin of left lower limb, including hip: Secondary | ICD-10-CM | POA: Diagnosis not present

## 2016-10-30 DIAGNOSIS — L821 Other seborrheic keratosis: Secondary | ICD-10-CM | POA: Diagnosis not present

## 2016-10-30 DIAGNOSIS — D485 Neoplasm of uncertain behavior of skin: Secondary | ICD-10-CM | POA: Diagnosis not present

## 2016-12-26 DIAGNOSIS — Z853 Personal history of malignant neoplasm of breast: Secondary | ICD-10-CM | POA: Diagnosis not present

## 2016-12-28 ENCOUNTER — Encounter: Payer: Self-pay | Admitting: Radiation Oncology

## 2016-12-28 ENCOUNTER — Ambulatory Visit
Admission: RE | Admit: 2016-12-28 | Discharge: 2016-12-28 | Disposition: A | Discharge status: Home / Self Care | Payer: Medicare Other | Source: Ambulatory Visit | Attending: Radiation Oncology | Admitting: Radiation Oncology

## 2016-12-28 DIAGNOSIS — Z79899 Other long term (current) drug therapy: Secondary | ICD-10-CM | POA: Diagnosis not present

## 2016-12-28 DIAGNOSIS — C50412 Malignant neoplasm of upper-outer quadrant of left female breast: Secondary | ICD-10-CM | POA: Insufficient documentation

## 2016-12-28 DIAGNOSIS — Z170 Estrogen receptor positive status [ER+]: Secondary | ICD-10-CM | POA: Insufficient documentation

## 2016-12-28 DIAGNOSIS — Z08 Encounter for follow-up examination after completed treatment for malignant neoplasm: Secondary | ICD-10-CM | POA: Diagnosis not present

## 2016-12-28 DIAGNOSIS — Z853 Personal history of malignant neoplasm of breast: Secondary | ICD-10-CM | POA: Diagnosis not present

## 2016-12-28 NOTE — Progress Notes (Signed)
Radiation Oncology         [phone] ________________________________  Name: Stacey Doyle MRN: [id number]  Date: 12/28/2016  DOB: 09-24-1946  Follow-Up Visit Note  CC: [name], MD  Tisovec, [name], MD    ICD-9-CM ICD-10-CM   1. Malignant neoplasm of upper-outer quadrant of left breast in female, estrogen receptor positive ([place]) 174.4 C50.412    V86.0 Z17.0     Diagnosis: Stage Ia (pT1c, pN0) invasive ductal carcinoma of the left breast (ER +, PR -, HER2 -)  Interval Since Last Radiation: 7 months  04/06/2016-05/25/2016  1.) Left breast, 50.4 Gy at 24 fractions  2.) Left breast boost, 12 Gy in 6 fractions.   Narrative:  The patient returns today for routine follow-up. She denies having pain or fatigue. She has decided not to take Tamoxifen because it makes her feel "horrible" with hot flashes and joint pain. The skin on her left breast is intact. She denies nipple discharge or bleeding. She had a repeat mammogram recently.   ALLERGIES:  is allergic to percocet [oxycodone-acetaminophen].  Meds: Current Outpatient Prescriptions  Medication Sig Dispense Refill  . atorvastatin (LIPITOR) 10 MG tablet Take 10 mg by mouth daily. Reported on 03/28/2016    . Biotin 1 MG CAPS Take by mouth.    . calcium carbonate (OS-CAL - DOSED IN MG OF ELEMENTAL CALCIUM) 1250 MG tablet Take 1 tablet by mouth.    . cetirizine (ZYRTEC) 10 MG tablet Take 10 mg by mouth daily.    . cholecalciferol (VITAMIN D) 1000 UNITS tablet Take 1,000 Units by mouth daily.    . clonazePAM (KLONOPIN) 0.5 MG tablet Take 0.5 mg by mouth daily.    . Cyanocobalamin (VITAMIN B-12) 5000 MCG LOZG Take 5,000 mg by mouth.    Marland Kitchen glucosamine-chondroitin 500-400 MG tablet Take 1 tablet by mouth 3 (three) times daily.    . NON FORMULARY Bee pollen    . olmesartan (BENICAR) 20 MG tablet Take 20 mg by mouth daily.    . vitamin C (ASCORBIC ACID) 250 MG tablet Take 250 mg by mouth daily.    . vitamin E 400 UNIT capsule  Take 400 Units by mouth daily.    . tamoxifen (NOLVADEX) 20 MG tablet Take 1 tablet (20 mg total) by mouth daily. (Patient not taking: Reported on 12/28/2016) 90 tablet 3   No current facility-administered medications for this encounter.     Physical Findings: The patient is in no acute distress. Patient is alert and oriented.  height is 5' 4.5" (1.638 m) and weight is 151 lb 3.2 oz (68.6 kg). Her oral temperature is 97.6 F (36.4 C). Her blood pressure is 149/74 (abnormal) and her pulse is 75. Her oxygen saturation is 100%.   Lungs are clear to auscultation bilaterally. Heart has regular rate and rhythm. No palpable cervical, supraclavicular, or axillary adenopathy. Hyperpigmentation changes over the left breast. No mass or nipple discharge. Right breast no palpable mass or nipple discharge. The patient has implants in place in both breasts.   Lab Findings: Lab Results  Component Value Date   WBC 4.8 02/18/2016   HGB 12.3 02/18/2016   HCT 39.2 02/18/2016   MCV 95.4 02/18/2016   PLT 161 02/18/2016    Radiographic Findings: No results found.  Impression: No sign of recurrence on clinical exam. . She recently had a mammogram with no evidence of recurrence.  Plan: She will return for follow up in radiation oncology as needed. She will continue  close follow up with medical oncology and surgery.   -----------------------------------  Blair Promise, PhD, MD  This document serves as a record of services personally performed by Gery Pray, MD. It was created on his behalf by Arlyce Harman, a trained medical scribe. The creation of this record is based on the scribe's personal observations and the provider's statements to them. This document has been checked and approved by the attending provider.

## 2016-12-28 NOTE — Progress Notes (Signed)
Stacey Doyle is here for follow up after treatment to her left breast.  She denies having pain or fatigue.  She has decided not to take Tamoxifen because it makes her feel "horrible" with hot flashes and joint pain.  The skin on her left breast is intact.  BP (!) 149/74 (BP Location: Right Arm, Patient Position: Sitting)   Pulse 75   Temp 97.6 F (36.4 C) (Oral)   Ht 5' 4.5" (1.638 m)   Wt 151 lb 3.2 oz (68.6 kg)   SpO2 100%   BMI 25.55 kg/m

## 2017-01-01 ENCOUNTER — Telehealth: Payer: Self-pay | Admitting: Hematology and Oncology

## 2017-01-01 NOTE — Telephone Encounter (Signed)
Patient called to cancel upcoming appointment, due to FLU. Will call back to reschedule when feeling better. 01/01/17.

## 2017-01-02 ENCOUNTER — Ambulatory Visit: Payer: Medicare Other | Admitting: Hematology and Oncology

## 2017-01-04 ENCOUNTER — Ambulatory Visit: Payer: Self-pay | Admitting: Radiation Oncology

## 2017-01-08 ENCOUNTER — Ambulatory Visit: Payer: Medicare Other | Admitting: Hematology and Oncology

## 2017-04-18 DIAGNOSIS — H2513 Age-related nuclear cataract, bilateral: Secondary | ICD-10-CM | POA: Diagnosis not present

## 2017-04-18 DIAGNOSIS — H524 Presbyopia: Secondary | ICD-10-CM | POA: Diagnosis not present

## 2017-04-27 ENCOUNTER — Encounter: Payer: Self-pay | Admitting: Genetics

## 2017-04-27 NOTE — Progress Notes (Signed)
Amendment: This genetic test report was amended on April 25, 2017.  Based on the 2015 ACMG standards and guidelines for the interpretation of sequence variants ([place] 2015) which utilize a combination of sources such as internal data, published literature, population databases, and in silico models the MUTYH c.1276C>T (p.[id number]) variant's classification has been reclassified to "likely benign".    The classification of the variant of uncertain significance (VUS) in POLE c.4523G>A (p.[id number]) that was identified has not been altered.

## 2017-05-28 DIAGNOSIS — N390 Urinary tract infection, site not specified: Secondary | ICD-10-CM | POA: Diagnosis not present

## 2017-05-28 DIAGNOSIS — R300 Dysuria: Secondary | ICD-10-CM | POA: Diagnosis not present

## 2017-07-23 DIAGNOSIS — R7301 Impaired fasting glucose: Secondary | ICD-10-CM | POA: Diagnosis not present

## 2017-07-23 DIAGNOSIS — I10 Essential (primary) hypertension: Secondary | ICD-10-CM | POA: Diagnosis not present

## 2017-07-23 DIAGNOSIS — E7800 Pure hypercholesterolemia, unspecified: Secondary | ICD-10-CM | POA: Diagnosis not present

## 2017-07-23 DIAGNOSIS — M859 Disorder of bone density and structure, unspecified: Secondary | ICD-10-CM | POA: Diagnosis not present

## 2017-07-30 DIAGNOSIS — R7301 Impaired fasting glucose: Secondary | ICD-10-CM | POA: Diagnosis not present

## 2017-07-30 DIAGNOSIS — E7800 Pure hypercholesterolemia, unspecified: Secondary | ICD-10-CM | POA: Diagnosis not present

## 2017-07-30 DIAGNOSIS — I10 Essential (primary) hypertension: Secondary | ICD-10-CM | POA: Diagnosis not present

## 2017-07-30 DIAGNOSIS — M1712 Unilateral primary osteoarthritis, left knee: Secondary | ICD-10-CM | POA: Diagnosis not present

## 2017-07-30 DIAGNOSIS — Z6825 Body mass index (BMI) 25.0-25.9, adult: Secondary | ICD-10-CM | POA: Diagnosis not present

## 2017-07-30 DIAGNOSIS — Z0000 Encounter for general adult medical examination without abnormal findings: Secondary | ICD-10-CM | POA: Diagnosis not present

## 2017-07-30 DIAGNOSIS — Z23 Encounter for immunization: Secondary | ICD-10-CM | POA: Diagnosis not present

## 2017-07-30 DIAGNOSIS — M859 Disorder of bone density and structure, unspecified: Secondary | ICD-10-CM | POA: Diagnosis not present

## 2017-07-30 DIAGNOSIS — D126 Benign neoplasm of colon, unspecified: Secondary | ICD-10-CM | POA: Diagnosis not present

## 2017-07-30 DIAGNOSIS — Z1389 Encounter for screening for other disorder: Secondary | ICD-10-CM | POA: Diagnosis not present

## 2017-07-30 DIAGNOSIS — C50919 Malignant neoplasm of unspecified site of unspecified female breast: Secondary | ICD-10-CM | POA: Diagnosis not present

## 2017-08-13 DIAGNOSIS — M7551 Bursitis of right shoulder: Secondary | ICD-10-CM | POA: Diagnosis not present

## 2017-08-20 DIAGNOSIS — M7551 Bursitis of right shoulder: Secondary | ICD-10-CM | POA: Diagnosis not present

## 2017-08-20 DIAGNOSIS — M25511 Pain in right shoulder: Secondary | ICD-10-CM | POA: Diagnosis not present

## 2017-09-12 DIAGNOSIS — M859 Disorder of bone density and structure, unspecified: Secondary | ICD-10-CM | POA: Diagnosis not present

## 2017-10-20 DIAGNOSIS — M25511 Pain in right shoulder: Secondary | ICD-10-CM | POA: Diagnosis not present

## 2017-10-25 DIAGNOSIS — M25511 Pain in right shoulder: Secondary | ICD-10-CM | POA: Diagnosis not present

## 2017-12-18 ENCOUNTER — Encounter: Payer: Self-pay | Admitting: Genetic Counselor

## 2017-12-18 DIAGNOSIS — D485 Neoplasm of uncertain behavior of skin: Secondary | ICD-10-CM | POA: Diagnosis not present

## 2017-12-18 DIAGNOSIS — D0439 Carcinoma in situ of skin of other parts of face: Secondary | ICD-10-CM | POA: Diagnosis not present

## 2017-12-18 NOTE — Progress Notes (Signed)
Amendment: POLE c.4523G>A (p.[id number]) has been reclassified from a VUS to a likely benign variant based on a combination of sources, e.g., internal data, published literature, population databases and in [place].  The reclassification date is October 19, 2017.Marland Kitchen

## 2017-12-19 DIAGNOSIS — Z853 Personal history of malignant neoplasm of breast: Secondary | ICD-10-CM | POA: Diagnosis not present

## 2018-02-12 DIAGNOSIS — Z85828 Personal history of other malignant neoplasm of skin: Secondary | ICD-10-CM | POA: Diagnosis not present

## 2018-02-12 DIAGNOSIS — C44329 Squamous cell carcinoma of skin of other parts of face: Secondary | ICD-10-CM | POA: Diagnosis not present

## 2018-02-20 DIAGNOSIS — G8918 Other acute postprocedural pain: Secondary | ICD-10-CM | POA: Diagnosis not present

## 2018-02-20 DIAGNOSIS — M24111 Other articular cartilage disorders, right shoulder: Secondary | ICD-10-CM | POA: Diagnosis not present

## 2018-02-20 DIAGNOSIS — S46011A Strain of muscle(s) and tendon(s) of the rotator cuff of right shoulder, initial encounter: Secondary | ICD-10-CM | POA: Diagnosis not present

## 2018-02-20 DIAGNOSIS — M75121 Complete rotator cuff tear or rupture of right shoulder, not specified as traumatic: Secondary | ICD-10-CM | POA: Diagnosis not present

## 2018-02-20 DIAGNOSIS — M7521 Bicipital tendinitis, right shoulder: Secondary | ICD-10-CM | POA: Diagnosis not present

## 2018-02-20 DIAGNOSIS — M7551 Bursitis of right shoulder: Secondary | ICD-10-CM | POA: Diagnosis not present

## 2018-02-20 DIAGNOSIS — M659 Synovitis and tenosynovitis, unspecified: Secondary | ICD-10-CM | POA: Diagnosis not present

## 2018-02-20 DIAGNOSIS — S46211A Strain of muscle, fascia and tendon of other parts of biceps, right arm, initial encounter: Secondary | ICD-10-CM | POA: Diagnosis not present

## 2018-02-26 DIAGNOSIS — S46011D Strain of muscle(s) and tendon(s) of the rotator cuff of right shoulder, subsequent encounter: Secondary | ICD-10-CM | POA: Diagnosis not present

## 2018-02-27 DIAGNOSIS — M25511 Pain in right shoulder: Secondary | ICD-10-CM | POA: Diagnosis not present

## 2018-02-27 DIAGNOSIS — M25611 Stiffness of right shoulder, not elsewhere classified: Secondary | ICD-10-CM | POA: Diagnosis not present

## 2018-02-27 DIAGNOSIS — M6281 Muscle weakness (generalized): Secondary | ICD-10-CM | POA: Diagnosis not present

## 2018-03-05 DIAGNOSIS — M6281 Muscle weakness (generalized): Secondary | ICD-10-CM | POA: Diagnosis not present

## 2018-03-05 DIAGNOSIS — M25611 Stiffness of right shoulder, not elsewhere classified: Secondary | ICD-10-CM | POA: Diagnosis not present

## 2018-03-05 DIAGNOSIS — M25511 Pain in right shoulder: Secondary | ICD-10-CM | POA: Diagnosis not present

## 2018-03-07 DIAGNOSIS — M6281 Muscle weakness (generalized): Secondary | ICD-10-CM | POA: Diagnosis not present

## 2018-03-07 DIAGNOSIS — M25511 Pain in right shoulder: Secondary | ICD-10-CM | POA: Diagnosis not present

## 2018-03-07 DIAGNOSIS — M25611 Stiffness of right shoulder, not elsewhere classified: Secondary | ICD-10-CM | POA: Diagnosis not present

## 2018-03-13 DIAGNOSIS — M25611 Stiffness of right shoulder, not elsewhere classified: Secondary | ICD-10-CM | POA: Diagnosis not present

## 2018-03-13 DIAGNOSIS — M6281 Muscle weakness (generalized): Secondary | ICD-10-CM | POA: Diagnosis not present

## 2018-03-13 DIAGNOSIS — M25511 Pain in right shoulder: Secondary | ICD-10-CM | POA: Diagnosis not present

## 2018-03-19 DIAGNOSIS — M25511 Pain in right shoulder: Secondary | ICD-10-CM | POA: Diagnosis not present

## 2018-03-22 DIAGNOSIS — M25511 Pain in right shoulder: Secondary | ICD-10-CM | POA: Diagnosis not present

## 2018-03-22 DIAGNOSIS — M25611 Stiffness of right shoulder, not elsewhere classified: Secondary | ICD-10-CM | POA: Diagnosis not present

## 2018-03-22 DIAGNOSIS — M6281 Muscle weakness (generalized): Secondary | ICD-10-CM | POA: Diagnosis not present

## 2018-04-03 DIAGNOSIS — M25611 Stiffness of right shoulder, not elsewhere classified: Secondary | ICD-10-CM | POA: Diagnosis not present

## 2018-04-03 DIAGNOSIS — M6281 Muscle weakness (generalized): Secondary | ICD-10-CM | POA: Diagnosis not present

## 2018-04-03 DIAGNOSIS — M25511 Pain in right shoulder: Secondary | ICD-10-CM | POA: Diagnosis not present

## 2018-04-12 DIAGNOSIS — M6281 Muscle weakness (generalized): Secondary | ICD-10-CM | POA: Diagnosis not present

## 2018-04-12 DIAGNOSIS — M25511 Pain in right shoulder: Secondary | ICD-10-CM | POA: Diagnosis not present

## 2018-04-12 DIAGNOSIS — M25611 Stiffness of right shoulder, not elsewhere classified: Secondary | ICD-10-CM | POA: Diagnosis not present

## 2018-04-17 DIAGNOSIS — M6281 Muscle weakness (generalized): Secondary | ICD-10-CM | POA: Diagnosis not present

## 2018-04-17 DIAGNOSIS — M25511 Pain in right shoulder: Secondary | ICD-10-CM | POA: Diagnosis not present

## 2018-04-17 DIAGNOSIS — M25611 Stiffness of right shoulder, not elsewhere classified: Secondary | ICD-10-CM | POA: Diagnosis not present

## 2018-04-24 DIAGNOSIS — M6281 Muscle weakness (generalized): Secondary | ICD-10-CM | POA: Diagnosis not present

## 2018-04-24 DIAGNOSIS — M25511 Pain in right shoulder: Secondary | ICD-10-CM | POA: Diagnosis not present

## 2018-04-24 DIAGNOSIS — M25611 Stiffness of right shoulder, not elsewhere classified: Secondary | ICD-10-CM | POA: Diagnosis not present

## 2018-05-21 DIAGNOSIS — M25511 Pain in right shoulder: Secondary | ICD-10-CM | POA: Diagnosis not present

## 2018-07-22 DIAGNOSIS — M25512 Pain in left shoulder: Secondary | ICD-10-CM | POA: Diagnosis not present

## 2018-07-31 DIAGNOSIS — E7800 Pure hypercholesterolemia, unspecified: Secondary | ICD-10-CM | POA: Diagnosis not present

## 2018-07-31 DIAGNOSIS — R7301 Impaired fasting glucose: Secondary | ICD-10-CM | POA: Diagnosis not present

## 2018-07-31 DIAGNOSIS — R82998 Other abnormal findings in urine: Secondary | ICD-10-CM | POA: Diagnosis not present

## 2018-07-31 DIAGNOSIS — I10 Essential (primary) hypertension: Secondary | ICD-10-CM | POA: Diagnosis not present

## 2018-07-31 DIAGNOSIS — M859 Disorder of bone density and structure, unspecified: Secondary | ICD-10-CM | POA: Diagnosis not present

## 2018-08-02 DIAGNOSIS — H04123 Dry eye syndrome of bilateral lacrimal glands: Secondary | ICD-10-CM | POA: Diagnosis not present

## 2018-08-02 DIAGNOSIS — H2513 Age-related nuclear cataract, bilateral: Secondary | ICD-10-CM | POA: Diagnosis not present

## 2018-08-02 DIAGNOSIS — H524 Presbyopia: Secondary | ICD-10-CM | POA: Diagnosis not present

## 2018-08-07 DIAGNOSIS — R7301 Impaired fasting glucose: Secondary | ICD-10-CM | POA: Diagnosis not present

## 2018-08-07 DIAGNOSIS — M859 Disorder of bone density and structure, unspecified: Secondary | ICD-10-CM | POA: Diagnosis not present

## 2018-08-07 DIAGNOSIS — Z1389 Encounter for screening for other disorder: Secondary | ICD-10-CM | POA: Diagnosis not present

## 2018-08-07 DIAGNOSIS — C50919 Malignant neoplasm of unspecified site of unspecified female breast: Secondary | ICD-10-CM | POA: Diagnosis not present

## 2018-08-07 DIAGNOSIS — R300 Dysuria: Secondary | ICD-10-CM | POA: Diagnosis not present

## 2018-08-07 DIAGNOSIS — M1712 Unilateral primary osteoarthritis, left knee: Secondary | ICD-10-CM | POA: Diagnosis not present

## 2018-08-07 DIAGNOSIS — Z0000 Encounter for general adult medical examination without abnormal findings: Secondary | ICD-10-CM | POA: Diagnosis not present

## 2018-08-07 DIAGNOSIS — I10 Essential (primary) hypertension: Secondary | ICD-10-CM | POA: Diagnosis not present

## 2018-08-07 DIAGNOSIS — N898 Other specified noninflammatory disorders of vagina: Secondary | ICD-10-CM | POA: Diagnosis not present

## 2018-08-07 DIAGNOSIS — Z6825 Body mass index (BMI) 25.0-25.9, adult: Secondary | ICD-10-CM | POA: Diagnosis not present

## 2018-08-07 DIAGNOSIS — E7800 Pure hypercholesterolemia, unspecified: Secondary | ICD-10-CM | POA: Diagnosis not present

## 2018-08-07 DIAGNOSIS — Z23 Encounter for immunization: Secondary | ICD-10-CM | POA: Diagnosis not present

## 2018-09-09 DIAGNOSIS — S62636A Displaced fracture of distal phalanx of right little finger, initial encounter for closed fracture: Secondary | ICD-10-CM | POA: Diagnosis not present

## 2018-09-30 DIAGNOSIS — S62636D Displaced fracture of distal phalanx of right little finger, subsequent encounter for fracture with routine healing: Secondary | ICD-10-CM | POA: Diagnosis not present

## 2019-01-07 ENCOUNTER — Encounter: Payer: Self-pay | Admitting: Hematology and Oncology

## 2019-01-07 DIAGNOSIS — Z853 Personal history of malignant neoplasm of breast: Secondary | ICD-10-CM | POA: Diagnosis not present

## 2019-06-18 DIAGNOSIS — D1801 Hemangioma of skin and subcutaneous tissue: Secondary | ICD-10-CM | POA: Diagnosis not present

## 2019-06-18 DIAGNOSIS — L812 Freckles: Secondary | ICD-10-CM | POA: Diagnosis not present

## 2019-06-18 DIAGNOSIS — Z85828 Personal history of other malignant neoplasm of skin: Secondary | ICD-10-CM | POA: Diagnosis not present

## 2019-06-18 DIAGNOSIS — L570 Actinic keratosis: Secondary | ICD-10-CM | POA: Diagnosis not present

## 2019-06-18 DIAGNOSIS — L821 Other seborrheic keratosis: Secondary | ICD-10-CM | POA: Diagnosis not present

## 2019-07-16 DIAGNOSIS — Z01818 Encounter for other preprocedural examination: Secondary | ICD-10-CM | POA: Diagnosis not present

## 2019-07-16 DIAGNOSIS — I10 Essential (primary) hypertension: Secondary | ICD-10-CM | POA: Diagnosis not present

## 2019-07-16 DIAGNOSIS — C50919 Malignant neoplasm of unspecified site of unspecified female breast: Secondary | ICD-10-CM | POA: Diagnosis not present

## 2019-07-16 DIAGNOSIS — M8580 Other specified disorders of bone density and structure, unspecified site: Secondary | ICD-10-CM | POA: Diagnosis not present

## 2019-07-16 DIAGNOSIS — Z23 Encounter for immunization: Secondary | ICD-10-CM | POA: Diagnosis not present

## 2019-07-16 DIAGNOSIS — E7800 Pure hypercholesterolemia, unspecified: Secondary | ICD-10-CM | POA: Diagnosis not present

## 2019-07-18 DIAGNOSIS — Z03818 Encounter for observation for suspected exposure to other biological agents ruled out: Secondary | ICD-10-CM | POA: Diagnosis not present

## 2019-07-18 DIAGNOSIS — Z01812 Encounter for preprocedural laboratory examination: Secondary | ICD-10-CM | POA: Diagnosis not present

## 2019-08-04 DIAGNOSIS — H04123 Dry eye syndrome of bilateral lacrimal glands: Secondary | ICD-10-CM | POA: Diagnosis not present

## 2019-08-04 DIAGNOSIS — H5203 Hypermetropia, bilateral: Secondary | ICD-10-CM | POA: Diagnosis not present

## 2019-08-04 DIAGNOSIS — H2513 Age-related nuclear cataract, bilateral: Secondary | ICD-10-CM | POA: Diagnosis not present

## 2019-09-15 DIAGNOSIS — R7301 Impaired fasting glucose: Secondary | ICD-10-CM | POA: Diagnosis not present

## 2019-09-15 DIAGNOSIS — M859 Disorder of bone density and structure, unspecified: Secondary | ICD-10-CM | POA: Diagnosis not present

## 2019-09-15 DIAGNOSIS — E7800 Pure hypercholesterolemia, unspecified: Secondary | ICD-10-CM | POA: Diagnosis not present

## 2019-09-16 DIAGNOSIS — M25512 Pain in left shoulder: Secondary | ICD-10-CM | POA: Diagnosis not present

## 2019-09-22 DIAGNOSIS — M8580 Other specified disorders of bone density and structure, unspecified site: Secondary | ICD-10-CM | POA: Diagnosis not present

## 2019-09-22 DIAGNOSIS — Z1331 Encounter for screening for depression: Secondary | ICD-10-CM | POA: Diagnosis not present

## 2019-09-22 DIAGNOSIS — Z0000 Encounter for general adult medical examination without abnormal findings: Secondary | ICD-10-CM | POA: Diagnosis not present

## 2019-09-22 DIAGNOSIS — Z1339 Encounter for screening examination for other mental health and behavioral disorders: Secondary | ICD-10-CM | POA: Diagnosis not present

## 2019-09-22 DIAGNOSIS — C50919 Malignant neoplasm of unspecified site of unspecified female breast: Secondary | ICD-10-CM | POA: Diagnosis not present

## 2019-09-22 DIAGNOSIS — I10 Essential (primary) hypertension: Secondary | ICD-10-CM | POA: Diagnosis not present

## 2019-09-22 DIAGNOSIS — N898 Other specified noninflammatory disorders of vagina: Secondary | ICD-10-CM | POA: Diagnosis not present

## 2019-09-22 DIAGNOSIS — R7301 Impaired fasting glucose: Secondary | ICD-10-CM | POA: Diagnosis not present

## 2019-09-22 DIAGNOSIS — E7800 Pure hypercholesterolemia, unspecified: Secondary | ICD-10-CM | POA: Diagnosis not present

## 2019-09-22 DIAGNOSIS — D696 Thrombocytopenia, unspecified: Secondary | ICD-10-CM | POA: Diagnosis not present

## 2019-09-22 DIAGNOSIS — D126 Benign neoplasm of colon, unspecified: Secondary | ICD-10-CM | POA: Diagnosis not present

## 2019-09-22 DIAGNOSIS — N393 Stress incontinence (female) (male): Secondary | ICD-10-CM | POA: Diagnosis not present

## 2019-09-22 DIAGNOSIS — M179 Osteoarthritis of knee, unspecified: Secondary | ICD-10-CM | POA: Diagnosis not present

## 2019-09-23 DIAGNOSIS — M8589 Other specified disorders of bone density and structure, multiple sites: Secondary | ICD-10-CM | POA: Diagnosis not present

## 2019-10-14 DIAGNOSIS — M25512 Pain in left shoulder: Secondary | ICD-10-CM | POA: Diagnosis not present

## 2019-10-21 DIAGNOSIS — M25512 Pain in left shoulder: Secondary | ICD-10-CM | POA: Diagnosis not present

## 2019-10-21 DIAGNOSIS — L570 Actinic keratosis: Secondary | ICD-10-CM | POA: Diagnosis not present

## 2019-10-21 DIAGNOSIS — Z85828 Personal history of other malignant neoplasm of skin: Secondary | ICD-10-CM | POA: Diagnosis not present

## 2019-12-05 ENCOUNTER — Other Ambulatory Visit: Payer: Self-pay | Admitting: Unknown Physician Specialty

## 2019-12-05 ENCOUNTER — Telehealth: Payer: Self-pay | Admitting: Unknown Physician Specialty

## 2019-12-05 DIAGNOSIS — U071 COVID-19: Secondary | ICD-10-CM | POA: Diagnosis not present

## 2019-12-05 DIAGNOSIS — R05 Cough: Secondary | ICD-10-CM | POA: Diagnosis not present

## 2019-12-05 DIAGNOSIS — J1282 Pneumonia due to coronavirus disease 2019: Secondary | ICD-10-CM | POA: Diagnosis not present

## 2019-12-05 NOTE — Telephone Encounter (Signed)
  I connected by phone with Stacey Doyle on 12/05/2019 at 4:44 PM to discuss the potential use of an new treatment for mild to moderate COVID-19 viral infection in non-hospitalized patients.  This patient is a 74 y.o. female that meets the FDA criteria for Emergency Use Authorization of bamlanivimab or casirivimab\imdevimab.  Has a (+) direct SARS-CoV-2 viral test result  Has mild or moderate COVID-19   Is ? 73 years of age and weighs ? 40 kg  Is NOT hospitalized due to COVID-19  Is NOT requiring oxygen therapy or requiring an increase in baseline oxygen flow rate due to COVID-19  Is within 10 days of symptom onset  Has at least one of the high risk factor(s) for progression to severe COVID-19 and/or hospitalization as defined in EUA.  Specific high risk criteria : >/= 73 yo   I have spoken and communicated the following to the patient or parent/caregiver:  1. FDA has authorized the emergency use of bamlanivimab and casirivimab\imdevimab for the treatment of mild to moderate COVID-19 in adults and pediatric patients with positive results of direct SARS-CoV-2 viral testing who are 41 years of age and older weighing at least 40 kg, and who are at high risk for progressing to severe COVID-19 and/or hospitalization.  2. The significant known and potential risks and benefits of bamlanivimab and casirivimab\imdevimab, and the extent to which such potential risks and benefits are unknown.  3. Information on available alternative treatments and the risks and benefits of those alternatives, including clinical trials.  4. Patients treated with bamlanivimab and casirivimab\imdevimab should continue to self-isolate and use infection control measures (e.g., wear mask, isolate, social distance, avoid sharing personal items, clean and disinfect "high touch" surfaces, and frequent handwashing) according to CDC guidelines.   5. The patient or parent/caregiver has the option to accept or refuse  bamlanivimab or casirivimab\imdevimab .  After reviewing this information with the patient, The patient agreed to proceed with receiving the bamlanimivab infusion and will be provided a copy of the Fact sheet prior to receiving the infusion.[name] 12/05/2019 4:44 PM

## 2019-12-08 ENCOUNTER — Ambulatory Visit (HOSPITAL_COMMUNITY)
Admission: RE | Admit: 2019-12-08 | Discharge: 2019-12-08 | Disposition: A | Discharge status: Home / Self Care | Payer: Medicare Other | Source: Ambulatory Visit | Attending: Pulmonary Disease | Admitting: Pulmonary Disease

## 2019-12-08 DIAGNOSIS — Z23 Encounter for immunization: Secondary | ICD-10-CM | POA: Insufficient documentation

## 2019-12-08 DIAGNOSIS — U071 COVID-19: Secondary | ICD-10-CM | POA: Diagnosis not present

## 2019-12-08 MED ORDER — SODIUM CHLORIDE 0.9 % IV SOLN
INTRAVENOUS | Status: DC | PRN
  Administered 2019-12-08: 250 mL via INTRAVENOUS

## 2019-12-08 MED ORDER — DIPHENHYDRAMINE HCL 50 MG/ML IJ SOLN: 50.0000 mg | Freq: Once | INTRAMUSCULAR | Status: DC | PRN

## 2019-12-08 MED ORDER — ALBUTEROL SULFATE HFA 108 (90 BASE) MCG/ACT IN AERS: 2.0000 | INHALATION_SPRAY | Freq: Once | RESPIRATORY_TRACT | Status: DC | PRN

## 2019-12-08 MED ORDER — SODIUM CHLORIDE 0.9 % IV SOLN
700.0000 mg | Freq: Once | INTRAVENOUS | Status: AC
  Administered 2019-12-08: 700 mg via INTRAVENOUS
  Filled 2019-12-08: qty 20

## 2019-12-08 MED ORDER — FAMOTIDINE IN NACL 20-0.9 MG/50ML-% IV SOLN: 20.0000 mg | Freq: Once | INTRAVENOUS | Status: DC | PRN

## 2019-12-08 MED ORDER — EPINEPHRINE 0.3 MG/0.3ML IJ SOAJ: 0.3000 mg | Freq: Once | INTRAMUSCULAR | Status: DC | PRN

## 2019-12-08 MED ORDER — METHYLPREDNISOLONE SODIUM SUCC 125 MG IJ SOLR: 125.0000 mg | Freq: Once | INTRAMUSCULAR | Status: DC | PRN

## 2019-12-08 NOTE — Progress Notes (Signed)
  Diagnosis: COVID-19  Physician: Dr. Asencion Noble  Procedure: [facility] Med: bamlanivimab infusion - Provided patient with bamlanimivab fact sheet for patients, parents and caregivers prior to infusion.  Complications: No immediate complications noted.  Discharge: Discharged home   Gaye Alken 12/08/2019

## 2019-12-08 NOTE — Discharge Instructions (Signed)

## 2019-12-18 DIAGNOSIS — R05 Cough: Secondary | ICD-10-CM | POA: Diagnosis not present

## 2019-12-18 DIAGNOSIS — U071 COVID-19: Secondary | ICD-10-CM | POA: Diagnosis not present

## 2020-01-06 DIAGNOSIS — M25442 Effusion, left hand: Secondary | ICD-10-CM | POA: Diagnosis not present

## 2020-01-06 DIAGNOSIS — G7080 Lambert-Eaton syndrome, unspecified: Secondary | ICD-10-CM | POA: Diagnosis not present

## 2020-01-06 DIAGNOSIS — S63285A Dislocation of proximal interphalangeal joint of left ring finger, initial encounter: Secondary | ICD-10-CM | POA: Diagnosis not present

## 2020-01-06 DIAGNOSIS — M1812 Unilateral primary osteoarthritis of first carpometacarpal joint, left hand: Secondary | ICD-10-CM | POA: Diagnosis not present

## 2020-01-06 DIAGNOSIS — M79645 Pain in left finger(s): Secondary | ICD-10-CM | POA: Diagnosis not present

## 2020-01-06 DIAGNOSIS — M11232 Other chondrocalcinosis, left wrist: Secondary | ICD-10-CM | POA: Diagnosis not present

## 2020-01-13 DIAGNOSIS — S63289D Dislocation of proximal interphalangeal joint of unspecified finger, subsequent encounter: Secondary | ICD-10-CM | POA: Diagnosis not present

## 2020-02-03 DIAGNOSIS — R928 Other abnormal and inconclusive findings on diagnostic imaging of breast: Secondary | ICD-10-CM | POA: Diagnosis not present

## 2020-02-04 DIAGNOSIS — S63285D Dislocation of proximal interphalangeal joint of left ring finger, subsequent encounter: Secondary | ICD-10-CM | POA: Diagnosis not present

## 2020-04-15 DIAGNOSIS — M545 Low back pain: Secondary | ICD-10-CM | POA: Diagnosis not present

## 2020-04-22 DIAGNOSIS — M545 Low back pain: Secondary | ICD-10-CM | POA: Diagnosis not present

## 2020-05-27 DIAGNOSIS — M48061 Spinal stenosis, lumbar region without neurogenic claudication: Secondary | ICD-10-CM | POA: Diagnosis not present

## 2020-05-27 DIAGNOSIS — M5416 Radiculopathy, lumbar region: Secondary | ICD-10-CM | POA: Diagnosis not present

## 2020-05-27 DIAGNOSIS — M545 Low back pain: Secondary | ICD-10-CM | POA: Diagnosis not present

## 2020-05-31 DIAGNOSIS — M6281 Muscle weakness (generalized): Secondary | ICD-10-CM | POA: Diagnosis not present

## 2020-05-31 DIAGNOSIS — M5416 Radiculopathy, lumbar region: Secondary | ICD-10-CM | POA: Diagnosis not present

## 2020-05-31 DIAGNOSIS — M48062 Spinal stenosis, lumbar region with neurogenic claudication: Secondary | ICD-10-CM | POA: Diagnosis not present

## 2020-06-03 DIAGNOSIS — M5416 Radiculopathy, lumbar region: Secondary | ICD-10-CM | POA: Diagnosis not present

## 2020-06-03 DIAGNOSIS — M6281 Muscle weakness (generalized): Secondary | ICD-10-CM | POA: Diagnosis not present

## 2020-06-03 DIAGNOSIS — M48062 Spinal stenosis, lumbar region with neurogenic claudication: Secondary | ICD-10-CM | POA: Diagnosis not present

## 2020-06-07 DIAGNOSIS — M6281 Muscle weakness (generalized): Secondary | ICD-10-CM | POA: Diagnosis not present

## 2020-06-07 DIAGNOSIS — M48062 Spinal stenosis, lumbar region with neurogenic claudication: Secondary | ICD-10-CM | POA: Diagnosis not present

## 2020-06-07 DIAGNOSIS — M5416 Radiculopathy, lumbar region: Secondary | ICD-10-CM | POA: Diagnosis not present

## 2020-06-10 DIAGNOSIS — M48062 Spinal stenosis, lumbar region with neurogenic claudication: Secondary | ICD-10-CM | POA: Diagnosis not present

## 2020-06-10 DIAGNOSIS — M5416 Radiculopathy, lumbar region: Secondary | ICD-10-CM | POA: Diagnosis not present

## 2020-06-10 DIAGNOSIS — M6281 Muscle weakness (generalized): Secondary | ICD-10-CM | POA: Diagnosis not present

## 2020-06-15 DIAGNOSIS — M6281 Muscle weakness (generalized): Secondary | ICD-10-CM | POA: Diagnosis not present

## 2020-06-15 DIAGNOSIS — M5416 Radiculopathy, lumbar region: Secondary | ICD-10-CM | POA: Diagnosis not present

## 2020-06-15 DIAGNOSIS — M48062 Spinal stenosis, lumbar region with neurogenic claudication: Secondary | ICD-10-CM | POA: Diagnosis not present

## 2020-06-24 DIAGNOSIS — M48062 Spinal stenosis, lumbar region with neurogenic claudication: Secondary | ICD-10-CM | POA: Diagnosis not present

## 2020-06-24 DIAGNOSIS — M5416 Radiculopathy, lumbar region: Secondary | ICD-10-CM | POA: Diagnosis not present

## 2020-06-24 DIAGNOSIS — M6281 Muscle weakness (generalized): Secondary | ICD-10-CM | POA: Diagnosis not present

## 2020-07-13 DIAGNOSIS — M48062 Spinal stenosis, lumbar region with neurogenic claudication: Secondary | ICD-10-CM | POA: Diagnosis not present

## 2020-07-13 DIAGNOSIS — M545 Low back pain: Secondary | ICD-10-CM | POA: Diagnosis not present

## 2020-07-13 DIAGNOSIS — M5416 Radiculopathy, lumbar region: Secondary | ICD-10-CM | POA: Diagnosis not present

## 2020-07-21 DIAGNOSIS — M5416 Radiculopathy, lumbar region: Secondary | ICD-10-CM | POA: Diagnosis not present

## 2020-07-21 DIAGNOSIS — M48062 Spinal stenosis, lumbar region with neurogenic claudication: Secondary | ICD-10-CM | POA: Diagnosis not present

## 2020-07-21 DIAGNOSIS — M545 Low back pain: Secondary | ICD-10-CM | POA: Diagnosis not present

## 2020-07-22 DIAGNOSIS — Z23 Encounter for immunization: Secondary | ICD-10-CM | POA: Diagnosis not present

## 2020-09-17 DIAGNOSIS — E7800 Pure hypercholesterolemia, unspecified: Secondary | ICD-10-CM | POA: Diagnosis not present

## 2020-09-17 DIAGNOSIS — R7301 Impaired fasting glucose: Secondary | ICD-10-CM | POA: Diagnosis not present

## 2020-09-17 DIAGNOSIS — M859 Disorder of bone density and structure, unspecified: Secondary | ICD-10-CM | POA: Diagnosis not present

## 2020-09-24 DIAGNOSIS — R82998 Other abnormal findings in urine: Secondary | ICD-10-CM | POA: Diagnosis not present

## 2020-09-24 DIAGNOSIS — Z23 Encounter for immunization: Secondary | ICD-10-CM | POA: Diagnosis not present

## 2020-09-24 DIAGNOSIS — N393 Stress incontinence (female) (male): Secondary | ICD-10-CM | POA: Diagnosis not present

## 2020-09-24 DIAGNOSIS — C50919 Malignant neoplasm of unspecified site of unspecified female breast: Secondary | ICD-10-CM | POA: Diagnosis not present

## 2020-09-24 DIAGNOSIS — R7301 Impaired fasting glucose: Secondary | ICD-10-CM | POA: Diagnosis not present

## 2020-09-24 DIAGNOSIS — M8580 Other specified disorders of bone density and structure, unspecified site: Secondary | ICD-10-CM | POA: Diagnosis not present

## 2020-09-24 DIAGNOSIS — D696 Thrombocytopenia, unspecified: Secondary | ICD-10-CM | POA: Diagnosis not present

## 2020-09-24 DIAGNOSIS — D126 Benign neoplasm of colon, unspecified: Secondary | ICD-10-CM | POA: Diagnosis not present

## 2020-09-24 DIAGNOSIS — M179 Osteoarthritis of knee, unspecified: Secondary | ICD-10-CM | POA: Diagnosis not present

## 2020-09-24 DIAGNOSIS — E7800 Pure hypercholesterolemia, unspecified: Secondary | ICD-10-CM | POA: Diagnosis not present

## 2020-09-24 DIAGNOSIS — Z0000 Encounter for general adult medical examination without abnormal findings: Secondary | ICD-10-CM | POA: Diagnosis not present

## 2020-09-24 DIAGNOSIS — Z8616 Personal history of COVID-19: Secondary | ICD-10-CM | POA: Diagnosis not present

## 2020-09-24 DIAGNOSIS — I10 Essential (primary) hypertension: Secondary | ICD-10-CM | POA: Diagnosis not present

## 2021-02-03 DIAGNOSIS — Z1231 Encounter for screening mammogram for malignant neoplasm of breast: Secondary | ICD-10-CM | POA: Diagnosis not present

## 2021-02-11 DIAGNOSIS — D3132 Benign neoplasm of left choroid: Secondary | ICD-10-CM | POA: Diagnosis not present

## 2021-02-11 DIAGNOSIS — H2513 Age-related nuclear cataract, bilateral: Secondary | ICD-10-CM | POA: Diagnosis not present

## 2021-02-11 DIAGNOSIS — H524 Presbyopia: Secondary | ICD-10-CM | POA: Diagnosis not present

## 2021-03-31 DIAGNOSIS — L570 Actinic keratosis: Secondary | ICD-10-CM | POA: Diagnosis not present

## 2021-03-31 DIAGNOSIS — Z85828 Personal history of other malignant neoplasm of skin: Secondary | ICD-10-CM | POA: Diagnosis not present

## 2021-09-19 DIAGNOSIS — M859 Disorder of bone density and structure, unspecified: Secondary | ICD-10-CM | POA: Diagnosis not present

## 2021-09-19 DIAGNOSIS — I10 Essential (primary) hypertension: Secondary | ICD-10-CM | POA: Diagnosis not present

## 2021-09-19 DIAGNOSIS — R7301 Impaired fasting glucose: Secondary | ICD-10-CM | POA: Diagnosis not present

## 2021-09-19 DIAGNOSIS — E7800 Pure hypercholesterolemia, unspecified: Secondary | ICD-10-CM | POA: Diagnosis not present

## 2021-09-26 DIAGNOSIS — M179 Osteoarthritis of knee, unspecified: Secondary | ICD-10-CM | POA: Diagnosis not present

## 2021-09-26 DIAGNOSIS — Z1331 Encounter for screening for depression: Secondary | ICD-10-CM | POA: Diagnosis not present

## 2021-09-26 DIAGNOSIS — R82998 Other abnormal findings in urine: Secondary | ICD-10-CM | POA: Diagnosis not present

## 2021-09-26 DIAGNOSIS — C50412 Malignant neoplasm of upper-outer quadrant of left female breast: Secondary | ICD-10-CM | POA: Diagnosis not present

## 2021-09-26 DIAGNOSIS — Z0000 Encounter for general adult medical examination without abnormal findings: Secondary | ICD-10-CM | POA: Diagnosis not present

## 2021-09-26 DIAGNOSIS — E7800 Pure hypercholesterolemia, unspecified: Secondary | ICD-10-CM | POA: Diagnosis not present

## 2021-09-26 DIAGNOSIS — Z1339 Encounter for screening examination for other mental health and behavioral disorders: Secondary | ICD-10-CM | POA: Diagnosis not present

## 2021-09-26 DIAGNOSIS — N393 Stress incontinence (female) (male): Secondary | ICD-10-CM | POA: Diagnosis not present

## 2021-09-26 DIAGNOSIS — M8580 Other specified disorders of bone density and structure, unspecified site: Secondary | ICD-10-CM | POA: Diagnosis not present

## 2021-09-26 DIAGNOSIS — Z23 Encounter for immunization: Secondary | ICD-10-CM | POA: Diagnosis not present

## 2021-09-26 DIAGNOSIS — R7301 Impaired fasting glucose: Secondary | ICD-10-CM | POA: Diagnosis not present

## 2021-09-26 DIAGNOSIS — I10 Essential (primary) hypertension: Secondary | ICD-10-CM | POA: Diagnosis not present

## 2022-01-24 DIAGNOSIS — M25512 Pain in left shoulder: Secondary | ICD-10-CM | POA: Diagnosis not present

## 2022-03-01 DIAGNOSIS — Z1231 Encounter for screening mammogram for malignant neoplasm of breast: Secondary | ICD-10-CM | POA: Diagnosis not present

## 2022-03-24 DIAGNOSIS — H2513 Age-related nuclear cataract, bilateral: Secondary | ICD-10-CM | POA: Diagnosis not present

## 2022-03-24 DIAGNOSIS — H524 Presbyopia: Secondary | ICD-10-CM | POA: Diagnosis not present

## 2022-04-15 DIAGNOSIS — M25571 Pain in right ankle and joints of right foot: Secondary | ICD-10-CM | POA: Diagnosis not present

## 2022-04-15 DIAGNOSIS — S93401A Sprain of unspecified ligament of right ankle, initial encounter: Secondary | ICD-10-CM | POA: Diagnosis not present

## 2022-05-08 DIAGNOSIS — B078 Other viral warts: Secondary | ICD-10-CM | POA: Diagnosis not present

## 2022-05-08 DIAGNOSIS — L821 Other seborrheic keratosis: Secondary | ICD-10-CM | POA: Diagnosis not present

## 2022-05-08 DIAGNOSIS — D225 Melanocytic nevi of trunk: Secondary | ICD-10-CM | POA: Diagnosis not present

## 2022-05-08 DIAGNOSIS — Z85828 Personal history of other malignant neoplasm of skin: Secondary | ICD-10-CM | POA: Diagnosis not present

## 2022-05-08 DIAGNOSIS — L570 Actinic keratosis: Secondary | ICD-10-CM | POA: Diagnosis not present

## 2022-05-08 DIAGNOSIS — L812 Freckles: Secondary | ICD-10-CM | POA: Diagnosis not present

## 2022-09-25 DIAGNOSIS — M25561 Pain in right knee: Secondary | ICD-10-CM | POA: Diagnosis not present

## 2022-10-19 DIAGNOSIS — Z1212 Encounter for screening for malignant neoplasm of rectum: Secondary | ICD-10-CM | POA: Diagnosis not present

## 2022-10-19 DIAGNOSIS — M8580 Other specified disorders of bone density and structure, unspecified site: Secondary | ICD-10-CM | POA: Diagnosis not present

## 2022-10-19 DIAGNOSIS — M8589 Other specified disorders of bone density and structure, multiple sites: Secondary | ICD-10-CM | POA: Diagnosis not present

## 2022-10-19 DIAGNOSIS — R7301 Impaired fasting glucose: Secondary | ICD-10-CM | POA: Diagnosis not present

## 2022-10-19 DIAGNOSIS — R7989 Other specified abnormal findings of blood chemistry: Secondary | ICD-10-CM | POA: Diagnosis not present

## 2022-10-19 DIAGNOSIS — E7800 Pure hypercholesterolemia, unspecified: Secondary | ICD-10-CM | POA: Diagnosis not present

## 2022-10-19 DIAGNOSIS — I10 Essential (primary) hypertension: Secondary | ICD-10-CM | POA: Diagnosis not present

## 2022-10-25 DIAGNOSIS — E7800 Pure hypercholesterolemia, unspecified: Secondary | ICD-10-CM | POA: Diagnosis not present

## 2022-10-25 DIAGNOSIS — Z1331 Encounter for screening for depression: Secondary | ICD-10-CM | POA: Diagnosis not present

## 2022-10-25 DIAGNOSIS — N393 Stress incontinence (female) (male): Secondary | ICD-10-CM | POA: Diagnosis not present

## 2022-10-25 DIAGNOSIS — R82998 Other abnormal findings in urine: Secondary | ICD-10-CM | POA: Diagnosis not present

## 2022-10-25 DIAGNOSIS — I10 Essential (primary) hypertension: Secondary | ICD-10-CM | POA: Diagnosis not present

## 2022-10-25 DIAGNOSIS — D126 Benign neoplasm of colon, unspecified: Secondary | ICD-10-CM | POA: Diagnosis not present

## 2022-10-25 DIAGNOSIS — Z23 Encounter for immunization: Secondary | ICD-10-CM | POA: Diagnosis not present

## 2022-10-25 DIAGNOSIS — Z0000 Encounter for general adult medical examination without abnormal findings: Secondary | ICD-10-CM | POA: Diagnosis not present

## 2022-10-25 DIAGNOSIS — Z1339 Encounter for screening examination for other mental health and behavioral disorders: Secondary | ICD-10-CM | POA: Diagnosis not present

## 2022-10-25 DIAGNOSIS — M179 Osteoarthritis of knee, unspecified: Secondary | ICD-10-CM | POA: Diagnosis not present

## 2022-10-25 DIAGNOSIS — R7301 Impaired fasting glucose: Secondary | ICD-10-CM | POA: Diagnosis not present

## 2022-10-25 DIAGNOSIS — C50919 Malignant neoplasm of unspecified site of unspecified female breast: Secondary | ICD-10-CM | POA: Diagnosis not present

## 2022-12-05 DIAGNOSIS — R5383 Other fatigue: Secondary | ICD-10-CM | POA: Diagnosis not present

## 2022-12-05 DIAGNOSIS — J029 Acute pharyngitis, unspecified: Secondary | ICD-10-CM | POA: Diagnosis not present

## 2022-12-05 DIAGNOSIS — I10 Essential (primary) hypertension: Secondary | ICD-10-CM | POA: Diagnosis not present

## 2022-12-05 DIAGNOSIS — G43909 Migraine, unspecified, not intractable, without status migrainosus: Secondary | ICD-10-CM | POA: Diagnosis not present

## 2022-12-05 DIAGNOSIS — R0981 Nasal congestion: Secondary | ICD-10-CM | POA: Diagnosis not present

## 2022-12-05 DIAGNOSIS — R051 Acute cough: Secondary | ICD-10-CM | POA: Diagnosis not present

## 2022-12-05 DIAGNOSIS — B349 Viral infection, unspecified: Secondary | ICD-10-CM | POA: Diagnosis not present

## 2022-12-05 DIAGNOSIS — Z1152 Encounter for screening for COVID-19: Secondary | ICD-10-CM | POA: Diagnosis not present

## 2023-04-19 DIAGNOSIS — Z1231 Encounter for screening mammogram for malignant neoplasm of breast: Secondary | ICD-10-CM | POA: Diagnosis not present

## 2023-05-09 DIAGNOSIS — L812 Freckles: Secondary | ICD-10-CM | POA: Diagnosis not present

## 2023-05-09 DIAGNOSIS — D1801 Hemangioma of skin and subcutaneous tissue: Secondary | ICD-10-CM | POA: Diagnosis not present

## 2023-05-09 DIAGNOSIS — L821 Other seborrheic keratosis: Secondary | ICD-10-CM | POA: Diagnosis not present

## 2023-05-09 DIAGNOSIS — Z853 Personal history of malignant neoplasm of breast: Secondary | ICD-10-CM | POA: Diagnosis not present

## 2023-05-09 DIAGNOSIS — L570 Actinic keratosis: Secondary | ICD-10-CM | POA: Diagnosis not present

## 2023-05-09 DIAGNOSIS — R922 Inconclusive mammogram: Secondary | ICD-10-CM | POA: Diagnosis not present

## 2023-05-09 DIAGNOSIS — Z85828 Personal history of other malignant neoplasm of skin: Secondary | ICD-10-CM | POA: Diagnosis not present

## 2023-05-29 DIAGNOSIS — D3132 Benign neoplasm of left choroid: Secondary | ICD-10-CM | POA: Diagnosis not present

## 2023-05-29 DIAGNOSIS — H5203 Hypermetropia, bilateral: Secondary | ICD-10-CM | POA: Diagnosis not present

## 2023-05-29 DIAGNOSIS — H2513 Age-related nuclear cataract, bilateral: Secondary | ICD-10-CM | POA: Diagnosis not present

## 2023-10-09 DIAGNOSIS — J0100 Acute maxillary sinusitis, unspecified: Secondary | ICD-10-CM | POA: Diagnosis not present

## 2023-10-29 DIAGNOSIS — M179 Osteoarthritis of knee, unspecified: Secondary | ICD-10-CM | POA: Diagnosis not present

## 2023-10-29 DIAGNOSIS — D696 Thrombocytopenia, unspecified: Secondary | ICD-10-CM | POA: Diagnosis not present

## 2023-10-29 DIAGNOSIS — I10 Essential (primary) hypertension: Secondary | ICD-10-CM | POA: Diagnosis not present

## 2023-10-29 DIAGNOSIS — Z23 Encounter for immunization: Secondary | ICD-10-CM | POA: Diagnosis not present

## 2023-10-29 DIAGNOSIS — D126 Benign neoplasm of colon, unspecified: Secondary | ICD-10-CM | POA: Diagnosis not present

## 2023-10-29 DIAGNOSIS — N393 Stress incontinence (female) (male): Secondary | ICD-10-CM | POA: Diagnosis not present

## 2023-10-29 DIAGNOSIS — R7301 Impaired fasting glucose: Secondary | ICD-10-CM | POA: Diagnosis not present

## 2023-10-29 DIAGNOSIS — F5104 Psychophysiologic insomnia: Secondary | ICD-10-CM | POA: Diagnosis not present

## 2023-10-29 DIAGNOSIS — C50919 Malignant neoplasm of unspecified site of unspecified female breast: Secondary | ICD-10-CM | POA: Diagnosis not present

## 2023-10-29 DIAGNOSIS — E7800 Pure hypercholesterolemia, unspecified: Secondary | ICD-10-CM | POA: Diagnosis not present

## 2023-10-29 DIAGNOSIS — Z0000 Encounter for general adult medical examination without abnormal findings: Secondary | ICD-10-CM | POA: Diagnosis not present

## 2023-10-29 DIAGNOSIS — R82998 Other abnormal findings in urine: Secondary | ICD-10-CM | POA: Diagnosis not present

## 2023-10-29 DIAGNOSIS — M8580 Other specified disorders of bone density and structure, unspecified site: Secondary | ICD-10-CM | POA: Diagnosis not present

## 2023-11-19 DIAGNOSIS — D0439 Carcinoma in situ of skin of other parts of face: Secondary | ICD-10-CM | POA: Diagnosis not present

## 2023-11-19 DIAGNOSIS — D485 Neoplasm of uncertain behavior of skin: Secondary | ICD-10-CM | POA: Diagnosis not present

## 2023-11-19 DIAGNOSIS — Z85828 Personal history of other malignant neoplasm of skin: Secondary | ICD-10-CM | POA: Diagnosis not present

## 2023-11-19 DIAGNOSIS — L570 Actinic keratosis: Secondary | ICD-10-CM | POA: Diagnosis not present

## 2023-11-29 DIAGNOSIS — H5203 Hypermetropia, bilateral: Secondary | ICD-10-CM | POA: Diagnosis not present

## 2023-11-29 DIAGNOSIS — H2513 Age-related nuclear cataract, bilateral: Secondary | ICD-10-CM | POA: Diagnosis not present

## 2023-11-30 DIAGNOSIS — J0100 Acute maxillary sinusitis, unspecified: Secondary | ICD-10-CM | POA: Diagnosis not present

## 2023-11-30 DIAGNOSIS — Z853 Personal history of malignant neoplasm of breast: Secondary | ICD-10-CM | POA: Diagnosis not present

## 2023-11-30 DIAGNOSIS — I10 Essential (primary) hypertension: Secondary | ICD-10-CM | POA: Diagnosis not present

## 2023-12-14 DIAGNOSIS — H2513 Age-related nuclear cataract, bilateral: Secondary | ICD-10-CM | POA: Diagnosis not present

## 2023-12-14 DIAGNOSIS — H43811 Vitreous degeneration, right eye: Secondary | ICD-10-CM | POA: Diagnosis not present

## 2023-12-24 DIAGNOSIS — H43811 Vitreous degeneration, right eye: Secondary | ICD-10-CM | POA: Diagnosis not present

## 2023-12-24 DIAGNOSIS — H2511 Age-related nuclear cataract, right eye: Secondary | ICD-10-CM | POA: Diagnosis not present

## 2024-01-09 DIAGNOSIS — D126 Benign neoplasm of colon, unspecified: Secondary | ICD-10-CM | POA: Diagnosis not present

## 2024-01-09 DIAGNOSIS — M179 Osteoarthritis of knee, unspecified: Secondary | ICD-10-CM | POA: Diagnosis not present

## 2024-01-09 DIAGNOSIS — N393 Stress incontinence (female) (male): Secondary | ICD-10-CM | POA: Diagnosis not present

## 2024-01-09 DIAGNOSIS — E785 Hyperlipidemia, unspecified: Secondary | ICD-10-CM | POA: Diagnosis not present

## 2024-01-09 DIAGNOSIS — F5104 Psychophysiologic insomnia: Secondary | ICD-10-CM | POA: Diagnosis not present

## 2024-01-09 DIAGNOSIS — C50919 Malignant neoplasm of unspecified site of unspecified female breast: Secondary | ICD-10-CM | POA: Diagnosis not present

## 2024-01-09 DIAGNOSIS — R7301 Impaired fasting glucose: Secondary | ICD-10-CM | POA: Diagnosis not present

## 2024-01-09 DIAGNOSIS — D696 Thrombocytopenia, unspecified: Secondary | ICD-10-CM | POA: Diagnosis not present

## 2024-01-09 DIAGNOSIS — M8580 Other specified disorders of bone density and structure, unspecified site: Secondary | ICD-10-CM | POA: Diagnosis not present

## 2024-01-09 DIAGNOSIS — I10 Essential (primary) hypertension: Secondary | ICD-10-CM | POA: Diagnosis not present

## 2024-01-09 DIAGNOSIS — E7800 Pure hypercholesterolemia, unspecified: Secondary | ICD-10-CM | POA: Diagnosis not present

## 2024-01-10 DIAGNOSIS — H2511 Age-related nuclear cataract, right eye: Secondary | ICD-10-CM | POA: Diagnosis not present

## 2024-01-10 DIAGNOSIS — H25811 Combined forms of age-related cataract, right eye: Secondary | ICD-10-CM | POA: Diagnosis not present

## 2024-02-18 DIAGNOSIS — Z85828 Personal history of other malignant neoplasm of skin: Secondary | ICD-10-CM | POA: Diagnosis not present

## 2024-04-30 DIAGNOSIS — Z1231 Encounter for screening mammogram for malignant neoplasm of breast: Secondary | ICD-10-CM | POA: Diagnosis not present

## 2024-05-26 DIAGNOSIS — H00012 Hordeolum externum right lower eyelid: Secondary | ICD-10-CM | POA: Diagnosis not present

## 2024-05-26 DIAGNOSIS — H10501 Unspecified blepharoconjunctivitis, right eye: Secondary | ICD-10-CM | POA: Diagnosis not present

## 2024-07-03 DIAGNOSIS — C50919 Malignant neoplasm of unspecified site of unspecified female breast: Secondary | ICD-10-CM | POA: Diagnosis not present

## 2024-07-03 DIAGNOSIS — E7800 Pure hypercholesterolemia, unspecified: Secondary | ICD-10-CM | POA: Diagnosis not present

## 2024-07-03 DIAGNOSIS — M179 Osteoarthritis of knee, unspecified: Secondary | ICD-10-CM | POA: Diagnosis not present

## 2024-07-03 DIAGNOSIS — I10 Essential (primary) hypertension: Secondary | ICD-10-CM | POA: Diagnosis not present

## 2024-07-03 DIAGNOSIS — M8580 Other specified disorders of bone density and structure, unspecified site: Secondary | ICD-10-CM | POA: Diagnosis not present

## 2024-07-03 DIAGNOSIS — R7301 Impaired fasting glucose: Secondary | ICD-10-CM | POA: Diagnosis not present

## 2024-07-03 DIAGNOSIS — F5104 Psychophysiologic insomnia: Secondary | ICD-10-CM | POA: Diagnosis not present

## 2024-07-03 DIAGNOSIS — D696 Thrombocytopenia, unspecified: Secondary | ICD-10-CM | POA: Diagnosis not present

## 2024-07-03 DIAGNOSIS — D126 Benign neoplasm of colon, unspecified: Secondary | ICD-10-CM | POA: Diagnosis not present

## 2024-07-03 DIAGNOSIS — N393 Stress incontinence (female) (male): Secondary | ICD-10-CM | POA: Diagnosis not present

## 2024-08-21 DIAGNOSIS — H2512 Age-related nuclear cataract, left eye: Secondary | ICD-10-CM | POA: Diagnosis not present

## 2024-08-21 DIAGNOSIS — Z961 Presence of intraocular lens: Secondary | ICD-10-CM | POA: Diagnosis not present
